# Patient Record
Sex: Male | Born: 1985 | Race: White | Hispanic: No | Marital: Married | Smoking: Never smoker
Health system: Southern US, Community
[De-identification: ages and names within clinical notes are randomized; demographics above are authoritative.]

## PROBLEM LIST (undated history)

## (undated) DIAGNOSIS — K219 Gastro-esophageal reflux disease without esophagitis: Secondary | ICD-10-CM

## (undated) DIAGNOSIS — Z8489 Family history of other specified conditions: Secondary | ICD-10-CM

## (undated) DIAGNOSIS — F909 Attention-deficit hyperactivity disorder, unspecified type: Secondary | ICD-10-CM

## (undated) DIAGNOSIS — F419 Anxiety disorder, unspecified: Secondary | ICD-10-CM

## (undated) DIAGNOSIS — K4090 Unilateral inguinal hernia, without obstruction or gangrene, not specified as recurrent: Secondary | ICD-10-CM

## (undated) DIAGNOSIS — I10 Essential (primary) hypertension: Secondary | ICD-10-CM

## (undated) DIAGNOSIS — J302 Other seasonal allergic rhinitis: Secondary | ICD-10-CM

## (undated) DIAGNOSIS — M51369 Other intervertebral disc degeneration, lumbar region without mention of lumbar back pain or lower extremity pain: Secondary | ICD-10-CM

## (undated) DIAGNOSIS — M5126 Other intervertebral disc displacement, lumbar region: Secondary | ICD-10-CM

## (undated) DIAGNOSIS — M5136 Other intervertebral disc degeneration, lumbar region: Secondary | ICD-10-CM

## (undated) HISTORY — PX: NO PAST SURGERIES: SHX2092

---

## 2014-09-04 ENCOUNTER — Encounter: Payer: Self-pay | Admitting: Emergency Medicine

## 2014-09-04 ENCOUNTER — Emergency Department (INDEPENDENT_AMBULATORY_CARE_PROVIDER_SITE_OTHER): Payer: Self-pay

## 2014-09-04 ENCOUNTER — Emergency Department
Admission: EM | Admit: 2014-09-04 | Discharge: 2014-09-04 | Disposition: A | Discharge status: Home / Self Care | Payer: Self-pay | Source: Home / Self Care | Attending: Family Medicine | Admitting: Family Medicine

## 2014-09-04 DIAGNOSIS — R0781 Pleurodynia: Secondary | ICD-10-CM

## 2014-09-04 DIAGNOSIS — R05 Cough: Secondary | ICD-10-CM

## 2014-09-04 DIAGNOSIS — S29012A Strain of muscle and tendon of back wall of thorax, initial encounter: Secondary | ICD-10-CM

## 2014-09-04 DIAGNOSIS — R059 Cough, unspecified: Secondary | ICD-10-CM

## 2014-09-04 HISTORY — DX: Anxiety disorder, unspecified: F41.9

## 2014-09-04 MED ORDER — MELOXICAM 15 MG PO TABS: 15.0000 mg | ORAL_TABLET | Freq: Every day | ORAL | Status: DC

## 2014-09-04 NOTE — Discharge Instructions (Signed)
Apply ice pack for 20 to 30 minutes, 3 to 4 times daily  Continue until pain decreases.  Wear ace wrap on chest daytime.  Begin range of motion exercises.

## 2014-09-04 NOTE — ED Notes (Signed)
Reports pulling muscles in right scapular area at work 8 days ago; has used tylenol without relief; now pain is moving to include right side.

## 2014-09-04 NOTE — ED Provider Notes (Signed)
CSN: [id number]     Arrival date & time 09/04/14  1820 History   First MD Initiated Contact with Patient 09/04/14 1951     Chief Complaint  Patient presents with  . Chest Pain      HPI Comments: Patient lifted a heavy package overhead about 8 or 9 days ago and heard/felt a pop in his right scapular area. He has continued to have pleuritic pain in his right chest, worse with movement, and two nights ago he had "cold sweats."  He denies cough or fever.                                                                                                                                                                                                                                                                                                                                                                                                                                                                                      The history is provided by the patient.    Past Medical History  Diagnosis Date  . ADD (attention deficit disorder)     as child; no longer on medication  . Anxiety  History reviewed. No pertinent past surgical history. History reviewed. No pertinent family history. History  Substance Use Topics  . Smoking status: Never Smoker   . Smokeless tobacco: Not on file  . Alcohol Use: Yes    Review of Systems  Constitutional: Positive for diaphoresis. Negative for fever, activity change and fatigue.  HENT: Negative.   Eyes: Negative.   Respiratory: Negative for cough, shortness of breath, wheezing and stridor.   Cardiovascular: Negative.   Gastrointestinal: Negative.   Genitourinary: Negative.   Musculoskeletal: Positive for back pain. Negative for myalgias, neck pain and neck stiffness.  Skin: Negative for rash.  All other systems reviewed and are negative.   Allergies  Review of patient's allergies indicates no known allergies.  Home Medications   Prior to Admission  medications   Medication Sig Start Date End Date Taking? Authorizing Provider  meloxicam (MOBIC) 15 MG tablet Take 1 tablet (15 mg total) by mouth daily. Take with food each morning 09/04/14   Lattie Haw, MD   BP 134/85 mmHg  Pulse 80  Temp(Src) 98 F (36.7 C) (Oral)  Resp 18  Ht 5\' 10"  (1.778 m)  Wt 207 lb (93.895 kg)  BMI 29.70 kg/m2  SpO2 100% Physical Exam  Constitutional: He is oriented to person, place, and time. He appears well-developed and well-nourished. No distress.  HENT:  Head: Normocephalic and atraumatic.  Right Ear: External ear normal.  Left Ear: External ear normal.  Mouth/Throat: Oropharynx is clear and moist. No oropharyngeal exudate.  Eyes: Conjunctivae are normal. Pupils are equal, round, and reactive to light.  Neck: Normal range of motion. Neck supple.  Cardiovascular: Normal heart sounds.   Pulmonary/Chest: Breath sounds normal.      There is tenderness along the inferior edge of right scapula extending to right lateral and anterior chest  as noted on diagram.              Abdominal: There is no tenderness.  Neurological: He is alert and oriented to person, place, and time.  Skin: Skin is warm and dry. No rash noted. He is not diaphoretic.  Nursing note and vitals reviewed.   ED Course  Procedures  none  Imaging Review Dg Chest 2 View  09/04/2014   CLINICAL DATA:  Cough.  EXAM: CHEST  2 VIEW  COMPARISON:  None.  FINDINGS: The heart size and mediastinal contours are within normal limits. Both lungs are clear. No pneumothorax or pleural effusion is noted. The visualized skeletal structures are unremarkable.  IMPRESSION: No acute cardiopulmonary abnormality seen.   Electronically Signed   By: Roque Lias M.D.   On: 09/04/2014 20:33   Dg Ribs Unilateral Right  09/04/2014   CLINICAL DATA:  Right-sided rib pain.  EXAM: RIGHT RIBS - 2 VIEW  COMPARISON:  None.  FINDINGS: No fracture or other bone lesions are seen involving the ribs.  IMPRESSION:  Normal right ribs.   Electronically Signed   By: Roque Lias M.D.   On: 09/04/2014 20:35     MDM   1. Rhomboid muscle strain, initial encounter   2. Rib pain on right side   3. Cough    Patient declines rib belt; ace wrap applied.  Rx for Mobic. Apply ice pack for 20 to 30 minutes, 3 to 4 times daily  Continue until pain decreases.  Wear ace wrap on chest daytime.  Begin range of motion exercises.   Followup with Dr. Rodney [facility] ([facility]) if not improving about two  weeks.     Lattie HawStephen A Beese, MD 09/09/14 [phone]

## 2015-02-26 ENCOUNTER — Emergency Department
Admission: EM | Admit: 2015-02-26 | Discharge: 2015-02-26 | Disposition: A | Discharge status: Home / Self Care | Payer: Self-pay | Source: Home / Self Care | Attending: Family Medicine | Admitting: Family Medicine

## 2015-02-26 ENCOUNTER — Encounter: Payer: Self-pay | Admitting: *Deleted

## 2015-02-26 ENCOUNTER — Emergency Department (INDEPENDENT_AMBULATORY_CARE_PROVIDER_SITE_OTHER): Payer: Self-pay

## 2015-02-26 DIAGNOSIS — M5432 Sciatica, left side: Secondary | ICD-10-CM

## 2015-02-26 DIAGNOSIS — M545 Low back pain: Secondary | ICD-10-CM

## 2015-02-26 DIAGNOSIS — R030 Elevated blood-pressure reading, without diagnosis of hypertension: Secondary | ICD-10-CM

## 2015-02-26 MED ORDER — PREDNISONE 20 MG PO TABS
ORAL_TABLET | ORAL | Status: DC
Start: 2015-02-26 — End: 2015-03-05

## 2015-02-26 NOTE — ED Notes (Signed)
Pt c/o LT leg pain with foot numbness x 9 days. Denies injury. Taking Advil prn pain.

## 2015-02-26 NOTE — ED Provider Notes (Signed)
CSN: [id number]     Arrival date & time 02/26/15  0815 History   First MD Initiated Contact with Patient 02/26/15 [phone]     Chief Complaint  Patient presents with  . Leg Pain      HPI Comments: Patient reports that he was working in a narrow crawl space 11 days ago when he felt a pulling sensation in his left posterior thigh.  The ache persisted, and two days ago he developed pain radiating down his left posterior leg to his foot, with occasional numbness in the lateral aspect of his left foot.  The pain is worse when sitting and improves when he lies supine on his right side.  He denies bowel or bladder dysfunction, and no saddle numbness.  He feels well otherwise.  No fevers, chills, and sweats. He reports that he has not had a history of hypertension, but his family history includes hypertension in his father.  His paternal great grandfather had an MI.    Patient is a 29 y.o. male presenting with back pain. The history is provided by the patient.  Back Pain Location:  Gluteal region Quality:  Aching Radiates to:  L posterior upper leg, L foot and L thigh Pain severity:  Moderate Pain is:  Same all the time Onset quality:  Gradual Duration:  11 days Timing:  Constant Progression:  Worsening Chronicity:  New Context: recent injury   Relieved by:  Nothing Worsened by:  Twisting and bending (sitting) Ineffective treatments:  NSAIDs Associated symptoms: leg pain, numbness and paresthesias   Associated symptoms: no abdominal pain, no bladder incontinence, no bowel incontinence, no chest pain, no dysuria, no fever, no pelvic pain, no perianal numbness, no tingling, no weakness and no weight loss   Risk factors: obesity     Past Medical History  Diagnosis Date  . ADD (attention deficit disorder)     as child; no longer on medication  . Anxiety    History reviewed. No pertinent past surgical history. Family History  Problem Relation Age of Onset  . Allergies Mother   . Fibromyalgia  Mother   . Hypertension Father   . ALS Father    History  Substance Use Topics  . Smoking status: Never Smoker   . Smokeless tobacco: Not on file  . Alcohol Use: Yes    Review of Systems  Constitutional: Negative for fever and weight loss.  Cardiovascular: Negative for chest pain.  Gastrointestinal: Negative for abdominal pain and bowel incontinence.  Genitourinary: Negative for bladder incontinence, dysuria and pelvic pain.  Musculoskeletal: Positive for back pain.  Neurological: Positive for numbness and paresthesias. Negative for tingling and weakness.  All other systems reviewed and are negative.   Allergies  Review of patient's allergies indicates no known allergies.  Home Medications   Prior to Admission medications   Medication Sig Start Date End Date Taking? Authorizing Provider  ibuprofen (ADVIL,MOTRIN) 200 MG tablet Take 200 mg by mouth every 6 (six) hours as needed.   Yes Historical Provider, MD  predniSONE (DELTASONE) 20 MG tablet Take one tab by mouth twice daily with food for five days, then decrease to one daily for four days. 02/26/15   Lattie Haw, MD   BP 159/102 mmHg  Pulse 63  Temp(Src) 97.4 F (36.3 C) (Oral)  Resp 18  Ht  (1.778 m)  Wt 212 lb (96.163 kg)  BMI 30.42 kg/m2  SpO2 98% Physical Exam  Constitutional: He is oriented to person, place, and time.  He appears well-developed and well-nourished. No distress.  HENT:  Head: Normocephalic.  Mouth/Throat: Oropharynx is clear and moist.  Eyes: Conjunctivae are normal. Pupils are equal, round, and reactive to light.  Neck: Normal range of motion. Neck supple.  Cardiovascular: Normal heart sounds.   Abdominal: There is no tenderness.  Musculoskeletal: He exhibits no edema.       Lumbar back: He exhibits decreased range of motion and tenderness.  Back:  Decreased forward flexion.  Tenderness in the midline and left paraspinous muscles from L4 to Sacral area.  Straight leg raising test is  positive on the left at about 15 degrees from horizontal.  Sitting knee extension test is positive on the left at about 75 degrees from horizontal.  Strength in the left lower leg is decreased.  Light touch in the left lower leg is normal.  Neurological: He is alert and oriented to person, place, and time. He displays abnormal reflex. He displays no Babinski's sign on the left side.  Reflex Scores:      Patellar reflexes are 2+ on the right side and 2+ on the left side.      Achilles reflexes are 2+ on the right side and 0 on the left side. Skin: Skin is warm and dry. No rash noted.  Nursing note and vitals reviewed.   ED Course  Procedures  none  Imaging Review Dg Lumbar Spine Complete  02/26/2015   CLINICAL DATA:  left thigh and leg pains s/p crawling out of a crawl space 1 week ago, ? Back injury,  EXAM: LUMBAR SPINE - COMPLETE 4+ VIEW  COMPARISON:  None.  FINDINGS: There is no evidence of lumbar spine fracture. Alignment is normal. Intervertebral disc spaces are maintained.  IMPRESSION: Negative.   Electronically Signed   By: Corlis Leak  Hassell M.D.   On: 02/26/2015 09:44     MDM   1. Back pain with left-sided sciatica   2. Blood pressure elevated without history of HTN    Begin prednisone burst. May take Tylenol at bedtime as needed for pain. Followup with Dr. Rodney Langtonhomas Thekkekandam ([facility]) for follow-up and management, including re-evaluation of BP.     Lattie HawStephen A Beese, MD 02/26/15 1043

## 2015-02-26 NOTE — Discharge Instructions (Signed)
May take Tylenol at bedtime as needed for pain.   Sciatica Sciatica is pain, weakness, numbness, or tingling along the path of the sciatic nerve. The nerve starts in the lower back and runs down the back of each leg. The nerve controls the muscles in the lower leg and in the back of the knee, while also providing sensation to the back of the thigh, lower leg, and the sole of your foot. Sciatica is a symptom of another medical condition. For instance, nerve damage or certain conditions, such as a herniated disk or bone spur on the spine, pinch or put pressure on the sciatic nerve. This causes the pain, weakness, or other sensations normally associated with sciatica. Generally, sciatica only affects one side of the body. CAUSES   Herniated or slipped disc.  Degenerative disk disease.  A pain disorder involving the narrow muscle in the buttocks (piriformis syndrome).  Pelvic injury or fracture.  Pregnancy.  Tumor (rare). SYMPTOMS  Symptoms can vary from mild to very severe. The symptoms usually travel from the low back to the buttocks and down the back of the leg. Symptoms can include:  Mild tingling or dull aches in the lower back, leg, or hip.  Numbness in the back of the calf or sole of the foot.  Burning sensations in the lower back, leg, or hip.  Sharp pains in the lower back, leg, or hip.  Leg weakness.  Severe back pain inhibiting movement. These symptoms may get worse with coughing, sneezing, laughing, or prolonged sitting or standing. Also, being overweight may worsen symptoms. DIAGNOSIS  Your caregiver will perform a physical exam to look for common symptoms of sciatica. He or she may ask you to do certain movements or activities that would trigger sciatic nerve pain. Other tests may be performed to find the cause of the sciatica. These may include:  Blood tests.  X-rays.  Imaging tests, such as an MRI or CT scan. TREATMENT  Treatment is directed at the cause of the  sciatic pain. Sometimes, treatment is not necessary and the pain and discomfort goes away on its own. If treatment is needed, your caregiver may suggest:  Over-the-counter medicines to relieve pain.  Prescription medicines, such as anti-inflammatory medicine, muscle relaxants, or narcotics.  Applying heat or ice to the painful area.  Steroid injections to lessen pain, irritation, and inflammation around the nerve.  Reducing activity during periods of pain.  Exercising and stretching to strengthen your abdomen and improve flexibility of your spine. Your caregiver may suggest losing weight if the extra weight makes the back pain worse.  Physical therapy.  Surgery to eliminate what is pressing or pinching the nerve, such as a bone spur or part of a herniated disk. HOME CARE INSTRUCTIONS   Only take over-the-counter or prescription medicines for pain or discomfort as directed by your caregiver.  Apply ice to the affected area for 20 minutes, 3-4 times a day for the first 48-72 hours. Then try heat in the same way.  Exercise, stretch, or perform your usual activities if these do not aggravate your pain.  Attend physical therapy sessions as directed by your caregiver.  Keep all follow-up appointments as directed by your caregiver.  Do not wear high heels or shoes that do not provide proper support.  Check your mattress to see if it is too soft. A firm mattress may lessen your pain and discomfort. SEEK IMMEDIATE MEDICAL CARE IF:   You lose control of your bowel or bladder (incontinence).  You have increasing weakness in the lower back, pelvis, buttocks, or legs.  You have redness or swelling of your back.  You have a burning sensation when you urinate.  You have pain that gets worse when you lie down or awakens you at night.  Your pain is worse than you have experienced in the past.  Your pain is lasting longer than 4 weeks.  You are suddenly losing weight without  reason. MAKE SURE YOU:  Understand these instructions.  Will watch your condition.  Will get help right away if you are not doing well or get worse. Document Released: 09/02/2001 Document Revised: 03/09/2012 Document Reviewed: 01/18/2012 [facility] Patient Information 2015 [place], [place]. This information is not intended to replace advice given to you by your health care provider. Make sure you discuss any questions you have with your health care provider.

## 2015-02-26 NOTE — ED Notes (Signed)
appt sch'ed with Dr T for 06/05/15 @ 9:00(Tammy).

## 2015-03-05 ENCOUNTER — Ambulatory Visit (INDEPENDENT_AMBULATORY_CARE_PROVIDER_SITE_OTHER): Payer: Self-pay | Admitting: Sports Medicine

## 2015-03-05 ENCOUNTER — Encounter: Payer: Self-pay | Admitting: Sports Medicine

## 2015-03-05 VITALS — BP 158/102 | HR 80 | Ht 70.0 in | Wt 211.0 lb

## 2015-03-05 DIAGNOSIS — M5416 Radiculopathy, lumbar region: Secondary | ICD-10-CM

## 2015-03-05 DIAGNOSIS — M5412 Radiculopathy, cervical region: Secondary | ICD-10-CM

## 2015-03-05 MED ORDER — MELOXICAM 15 MG PO TABS: ORAL_TABLET | ORAL | Status: DC
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
## 2015-03-05 NOTE — Assessment & Plan Note (Signed)
Classic left S1 distribution. Unfortunately he has completely lost his left S1 reflex, and is fairly weak to plantar flexion on the left side. He's finished his prednisone, does not yet have insurance, so we aren't able to get an MRI or physical therapy. We will be meloxicam, continue home exercises. He will return in a month, if no better we will proceed very quickly to MRI for interventional planning.

## 2015-03-05 NOTE — Assessment & Plan Note (Signed)
Occurred years ago. Now with persistent left-sided C7 radiculopathy. Again, avoiding x-rays and imaging due to lack of insurance which she will have in one month. Home Rex home rehabilitation exercises given. Return in a month. His father was recently diagnosed with amyotrophic lateral sclerosis, considering his multifocal neurologic complaints we certainly need to keep this in the back of her head this possible diagnosis.

## 2015-04-02 ENCOUNTER — Encounter: Payer: Self-pay | Admitting: Sports Medicine

## 2015-04-02 ENCOUNTER — Ambulatory Visit (INDEPENDENT_AMBULATORY_CARE_PROVIDER_SITE_OTHER): Payer: Managed Care, Other (non HMO) | Admitting: Sports Medicine

## 2015-04-02 VITALS — BP 161/102 | HR 61 | Ht 70.0 in | Wt 209.0 lb

## 2015-04-02 DIAGNOSIS — M5412 Radiculopathy, cervical region: Secondary | ICD-10-CM

## 2015-04-02 DIAGNOSIS — M5416 Radiculopathy, lumbar region: Secondary | ICD-10-CM

## 2015-04-02 DIAGNOSIS — I10 Essential (primary) hypertension: Secondary | ICD-10-CM

## 2015-04-02 MED ORDER — PREDNISONE 10 MG (21) PO TBPK: ORAL_TABLET | ORAL | Status: DC

## 2015-04-02 NOTE — Progress Notes (Signed)
  Subjective:    CC: Follow-up  HPI: Left lumbar radiculopathy: 80% improved with prednisone and rehabilitation exercises  Left cervical radiculopathy: 80% improved with prednisone and rehabilitation exercises  Hypertension: Would like to work a little harder on his low-sodium diet before he proceed to any form of oral intervention.  Past medical history, Surgical history, Family history not pertinant except as noted below, Social history, Allergies, and medications have been entered into the medical record, reviewed, and no changes needed.   Review of Systems: No fevers, chills, night sweats, weight loss, chest pain, or shortness of breath.   Objective:    General: Well Developed, well nourished, and in no acute distress.  Neuro: Alert and oriented x3, extra-ocular muscles intact, sensation grossly intact.  HEENT: Normocephalic, atraumatic, pupils equal round reactive to light, neck supple, no masses, no lymphadenopathy, thyroid nonpalpable.  Skin: Warm and dry, no rashes. Cardiac: Regular rate and rhythm, no murmurs rubs or gallops, no lower extremity edema.  Respiratory: Clear to auscultation bilaterally. Not using accessory muscles, speaking in full sentences.  Impression and Recommendations:

## 2015-04-02 NOTE — Assessment & Plan Note (Signed)
80% improved. We'll do a single additional course of prednisone in a taper, and if still having symptoms we will get an MRI of the next visit. He does have a family history of ALS.

## 2015-04-02 NOTE — Assessment & Plan Note (Signed)
80% improved. We'll do a single additional course of prednisone in a taper, and if still having symptoms we will get an MRI of the next visit. He does have a family history of ALS. 

## 2015-04-02 NOTE — Assessment & Plan Note (Signed)
Continue to work on low-sodium diet, if still elevated at the next visit we will treat him.

## 2015-04-30 ENCOUNTER — Ambulatory Visit (INDEPENDENT_AMBULATORY_CARE_PROVIDER_SITE_OTHER): Payer: Managed Care, Other (non HMO) | Admitting: Sports Medicine

## 2015-04-30 ENCOUNTER — Encounter: Payer: Self-pay | Admitting: Sports Medicine

## 2015-04-30 VITALS — BP 151/98 | HR 75 | Ht 70.0 in | Wt 216.0 lb

## 2015-04-30 DIAGNOSIS — M5412 Radiculopathy, cervical region: Secondary | ICD-10-CM

## 2015-04-30 DIAGNOSIS — I10 Essential (primary) hypertension: Secondary | ICD-10-CM

## 2015-04-30 DIAGNOSIS — M5416 Radiculopathy, lumbar region: Secondary | ICD-10-CM | POA: Diagnosis not present

## 2015-04-30 MED ORDER — LISINOPRIL 10 MG PO TABS: 10.0000 mg | ORAL_TABLET | Freq: Every day | ORAL | Status: DC

## 2015-04-30 NOTE — Assessment & Plan Note (Signed)
Continues to be elevated, starting 10 mg of lisinopril, return in 2 weeks to check blood work and renal function

## 2015-04-30 NOTE — Progress Notes (Signed)
  Subjective:    CC: Follow-up  HPI: Lumbar radiculopathy: Resolved and well controlled with occasional meloxicam.  Cervical radiculopathy: Resolved and controlled with occasional meloxicam.  Hypertension: Persistent despite several months of a low-sodium diet.  Past medical history, Surgical history, Family history not pertinant except as noted below, Social history, Allergies, and medications have been entered into the medical record, reviewed, and no changes needed.   Review of Systems: No fevers, chills, night sweats, weight loss, chest pain, or shortness of breath.   Objective:    General: Well Developed, well nourished, and in no acute distress.  Neuro: Alert and oriented x3, extra-ocular muscles intact, sensation grossly intact.  HEENT: Normocephalic, atraumatic, pupils equal round reactive to light, neck supple, no masses, no lymphadenopathy, thyroid nonpalpable.  Skin: Warm and dry, no rashes. Cardiac: Regular rate and rhythm, no murmurs rubs or gallops, no lower extremity edema.  Respiratory: Clear to auscultation bilaterally. Not using accessory muscles, speaking in full sentences.  Impression and Recommendations:    I spent 25 minutes with this patient, greater than 50% was face-to-face time counseling regarding the above diagnoses

## 2015-04-30 NOTE — Assessment & Plan Note (Signed)
Relatively well-controlled with an occasional meloxicam at this point. No need to proceed further to advanced imaging.

## 2015-04-30 NOTE — Assessment & Plan Note (Signed)
Relatively well-controlled with an occasional meloxicam at this point. No need to proceed further to advanced imaging. 

## 2015-05-14 ENCOUNTER — Encounter: Payer: Self-pay | Admitting: Sports Medicine

## 2015-05-14 ENCOUNTER — Ambulatory Visit (INDEPENDENT_AMBULATORY_CARE_PROVIDER_SITE_OTHER): Payer: Managed Care, Other (non HMO) | Admitting: Sports Medicine

## 2015-05-14 ENCOUNTER — Ambulatory Visit (INDEPENDENT_AMBULATORY_CARE_PROVIDER_SITE_OTHER): Payer: Managed Care, Other (non HMO)

## 2015-05-14 VITALS — BP 154/94 | HR 95 | Ht 70.0 in | Wt 212.0 lb

## 2015-05-14 DIAGNOSIS — I10 Essential (primary) hypertension: Secondary | ICD-10-CM | POA: Diagnosis not present

## 2015-05-14 DIAGNOSIS — M5127 Other intervertebral disc displacement, lumbosacral region: Secondary | ICD-10-CM

## 2015-05-14 DIAGNOSIS — M5416 Radiculopathy, lumbar region: Secondary | ICD-10-CM | POA: Diagnosis not present

## 2015-05-14 LAB — BASIC METABOLIC PANEL
BUN: 19 mg/dL (ref 7–25)
Calcium: 9.2 mg/dL (ref 8.6–10.3)
Creat: 0.84 mg/dL (ref 0.60–1.35)
Glucose, Bld: 100 mg/dL — ABNORMAL HIGH (ref 65–99)

## 2015-05-14 LAB — BASIC METABOLIC PANEL WITH GFR
CO2: 28 mmol/L (ref 20–31)
Chloride: 104 mmol/L (ref 98–110)
Potassium: 4.8 mmol/L (ref 3.5–5.3)
Sodium: 142 mmol/L (ref 135–146)

## 2015-05-14 MED ORDER — PREDNISONE 50 MG PO TABS: ORAL_TABLET | ORAL | Status: DC

## 2015-05-14 NOTE — Progress Notes (Signed)
  Subjective:    CC: follow-up  HPI: Hypertension: Still elevated despite 10 mg of lisinopril, he is in a great deal of pain today, no lip swelling, shortness of breath or cough. No chest pain, headaches, visual changes  Low back pain: History of left lumbar radiculopathy: Did extremely well initially with a conservative course of treatment. Unfortunately having recurrence of left-sided S1 radiculopathy. No bowel or bladder dysfunction, saddle numbness, or constitutional symptoms.  Past medical history, Surgical history, Family history not pertinant except as noted below, Social history, Allergies, and medications have been entered into the medical record, reviewed, and no changes needed.   Review of Systems: No fevers, chills, night sweats, weight loss, chest pain, or shortness of breath.   Objective:    General: Well Developed, well nourished, and in no acute distress.  Neuro: Alert and oriented x3, extra-ocular muscles intact, sensation grossly intact.  HEENT: Normocephalic, atraumatic, pupils equal round reactive to light, neck supple, no masses, no lymphadenopathy, thyroid nonpalpable.  Skin: Warm and dry, no rashes. Cardiac: Regular rate and rhythm, no murmurs rubs or gallops, no lower extremity edema.  Respiratory: Clear to auscultation bilaterally. Not using accessory muscles, speaking in full sentences. Back Exam:  Inspection: Unremarkable  Motion: Flexion 45 deg, Extension 45 deg, Side Bending to 45 deg bilaterally,  Rotation to 45 deg bilaterally  SLR laying: Negative  XSLR laying: Negative  Palpable tenderness: None. FABER: negative. Sensory change: Gross sensation intact to all lumbar and sacral dermatomes.  Reflexes: 2+ at both patellar tendons, 2+ at achilles tendons, Babinski's downgoing.  Strength at foot  Plantar-flexion: 5/5 Dorsi-flexion: 5/5 Eversion: 5/5 Inversion: 5/5  Leg strength  Quad: 5/5 Hamstring: 5/5 Hip flexor: 5/5 Hip abductors: 5/5  Gait  unremarkable.  MRI personally reviewed, there is a large left-sided L5-S1 disc protrusion with clear mass effect on the intraspinal left descending S1 nerve.  Impression and Recommendations:

## 2015-05-14 NOTE — Assessment & Plan Note (Addendum)
Unfortunately having a recurrence, trying to do the rehabilitation exercises but having some pain, prednisone, at this point we are going to obtain an MRI, he has failed greater than 6 weeks of physician directed rehabilitation, NSAIDs, return to see me to go over MRI results.  Considering large S1 protrusion, as well as some weakness of the leg I am going to set him up with a left-sided selective S1 epidural, without also like him touch base with [facility] downstairs considering his weakness.

## 2015-05-14 NOTE — Assessment & Plan Note (Signed)
Elevated today but patient is in significant pain from recurrence of lumbar radiculopathy. For this reason we are only going to increase to 20 mg of lisinopril, checking a BMP, patient will return to go over his MRI results and recheck his blood pressure.

## 2015-05-15 ENCOUNTER — Encounter: Payer: Self-pay | Admitting: Sports Medicine

## 2015-05-15 ENCOUNTER — Ambulatory Visit (INDEPENDENT_AMBULATORY_CARE_PROVIDER_SITE_OTHER): Payer: Managed Care, Other (non HMO) | Admitting: Sports Medicine

## 2015-05-15 VITALS — BP 153/92 | HR 85 | Ht 70.0 in | Wt 210.0 lb

## 2015-05-15 DIAGNOSIS — M5416 Radiculopathy, lumbar region: Secondary | ICD-10-CM

## 2015-05-15 NOTE — Progress Notes (Signed)
  Subjective:    CC: MRI results  HPI: Jakyron returns, he has progressive left-sided S1 radiculopathy now with weakness, he responded well initially to conservative measures including prednisone and physical therapy, unfortunately having a recurrence of pain with weakness to plantar flexion of the left foot.  He does not have any bowel or bladder dysfunction or other symptoms suggestive of cauda equina syndrome or spinal infection.   Past medical history, Surgical history, Family history not pertinant except as noted below, Social history, Allergies, and medications have been entered into the medical record, reviewed, and no changes needed.   Review of Systems: No fevers, chills, night sweats, weight loss, chest pain, or shortness of breath.   Objective:    General: Well Developed, well nourished, and in no acute distress.  Neuro: Alert and oriented x3, extra-ocular muscles intact, sensation grossly intact.  HEENT: Normocephalic, atraumatic, pupils equal round reactive to light, neck supple, no masses, no lymphadenopathy, thyroid nonpalpable.  Skin: Warm and dry, no rashes. Cardiac: Regular rate and rhythm, no murmurs rubs or gallops, no lower extremity edema.  Respiratory: Clear to auscultation bilaterally. Not using accessory muscles, speaking in full sentences.  MRI personally reviewed and shows an L5-S1 disc extrusion with a component that appears to be sequestered clearly contacting the intraspinal left S1 nerve root.  Impression and Recommendations:

## 2015-05-15 NOTE — Assessment & Plan Note (Addendum)
Douglas Lang will pick up his prednisone, I would also like him to proceed with an S1 selective epidural and the left side, considering the size of the extrusion as well as progressive weakness I would like and also touch base with [facility] for consideration of microdiscectomy. I would also like to see Anil back in one month.  Spoke with Dr. Yetta Barre with neurosurgery, patient will be seeing him at 1:30 today. I also called [name], and he agrees to be downstairs in physician specialty offices at 1:30 PM.

## 2015-05-16 ENCOUNTER — Ambulatory Visit
Admission: RE | Admit: 2015-05-16 | Discharge: 2015-05-16 | Disposition: A | Discharge status: Home / Self Care | Payer: Managed Care, Other (non HMO) | Source: Ambulatory Visit | Attending: Sports Medicine | Admitting: Sports Medicine

## 2015-05-16 VITALS — BP 167/75 | HR 83

## 2015-05-16 DIAGNOSIS — F32A Depression, unspecified: Secondary | ICD-10-CM | POA: Insufficient documentation

## 2015-05-16 DIAGNOSIS — Z0000 Encounter for general adult medical examination without abnormal findings: Secondary | ICD-10-CM | POA: Insufficient documentation

## 2015-05-16 DIAGNOSIS — M5416 Radiculopathy, lumbar region: Secondary | ICD-10-CM

## 2015-05-16 DIAGNOSIS — F329 Major depressive disorder, single episode, unspecified: Secondary | ICD-10-CM | POA: Insufficient documentation

## 2015-05-16 MED ORDER — IOHEXOL 180 MG/ML  SOLN
1.0000 mL | Freq: Once | INTRAMUSCULAR | Status: DC | PRN
Start: 2015-05-16 — End: 2015-05-17
  Administered 2015-05-16: 1 mL via EPIDURAL

## 2015-05-16 MED ORDER — METHYLPREDNISOLONE ACETATE 40 MG/ML INJ SUSP (RADIOLOG
120.0000 mg | Freq: Once | INTRAMUSCULAR | Status: AC
  Administered 2015-05-16: 120 mg via EPIDURAL

## 2015-05-16 NOTE — Discharge Instructions (Signed)

## 2015-05-24 ENCOUNTER — Other Ambulatory Visit: Payer: Self-pay | Admitting: Sports Medicine

## 2015-05-24 MED ORDER — LISINOPRIL 20 MG PO TABS: 20.0000 mg | ORAL_TABLET | Freq: Every day | ORAL | Status: DC

## 2015-09-02 ENCOUNTER — Other Ambulatory Visit: Payer: Self-pay | Admitting: Sports Medicine

## 2015-10-20 ENCOUNTER — Other Ambulatory Visit: Payer: Self-pay | Admitting: Sports Medicine

## 2015-11-24 ENCOUNTER — Other Ambulatory Visit: Payer: Self-pay | Admitting: Sports Medicine

## 2016-01-01 ENCOUNTER — Other Ambulatory Visit: Payer: Self-pay | Admitting: Sports Medicine

## 2016-01-08 ENCOUNTER — Telehealth: Payer: Self-pay | Admitting: Sports Medicine

## 2016-01-08 NOTE — Telephone Encounter (Signed)
I called pt to let him know he is due for a f/u appt with Dr.Thekkekandam and he states he will call to schedule

## 2016-01-14 ENCOUNTER — Ambulatory Visit: Payer: Managed Care, Other (non HMO) | Admitting: Sports Medicine

## 2016-01-21 ENCOUNTER — Ambulatory Visit (INDEPENDENT_AMBULATORY_CARE_PROVIDER_SITE_OTHER): Payer: Managed Care, Other (non HMO) | Admitting: Sports Medicine

## 2016-01-21 VITALS — BP 133/91 | HR 69 | Wt 220.0 lb

## 2016-01-21 DIAGNOSIS — F329 Major depressive disorder, single episode, unspecified: Secondary | ICD-10-CM | POA: Diagnosis not present

## 2016-01-21 DIAGNOSIS — M5416 Radiculopathy, lumbar region: Secondary | ICD-10-CM | POA: Diagnosis not present

## 2016-01-21 DIAGNOSIS — I10 Essential (primary) hypertension: Secondary | ICD-10-CM

## 2016-01-21 DIAGNOSIS — F32A Depression, unspecified: Secondary | ICD-10-CM

## 2016-01-21 LAB — CBC
HCT: 47.3 % (ref 38.5–50.0)
Hemoglobin: 16.5 g/dL (ref 13.2–17.1)
MCH: 32 pg (ref 27.0–33.0)
MCHC: 34.9 g/dL (ref 32.0–36.0)
MCV: 91.7 fL (ref 80.0–100.0)
MPV: 10.6 fL (ref 7.5–12.5)
Platelets: 230 10*3/uL (ref 140–400)
RBC: 5.16 MIL/uL (ref 4.20–5.80)
RDW: 13.1 % (ref 11.0–15.0)
WBC: 5 10*3/uL (ref 3.8–10.8)

## 2016-01-21 LAB — LIPID PANEL
Cholesterol: 161 mg/dL (ref 125–200)
HDL: 32 mg/dL — ABNORMAL LOW (ref >=40)
LDL Cholesterol: 94 mg/dL (ref <130)
Total CHOL/HDL Ratio: 5 ratio (ref <=5.0)
Triglycerides: 177 mg/dL — ABNORMAL HIGH (ref <150)
VLDL: 35 mg/dL — ABNORMAL HIGH (ref <30)

## 2016-01-21 LAB — COMPREHENSIVE METABOLIC PANEL WITH GFR
AST: 37 U/L (ref 10–40)
Albumin: 4.3 g/dL (ref 3.6–5.1)
CO2: 28 mmol/L (ref 20–31)
Calcium: 9.2 mg/dL (ref 8.6–10.3)
Chloride: 102 mmol/L (ref 98–110)
Total Bilirubin: 0.5 mg/dL (ref 0.2–1.2)
Total Protein: 6.8 g/dL (ref 6.1–8.1)

## 2016-01-21 LAB — COMPREHENSIVE METABOLIC PANEL
ALT: 91 U/L — ABNORMAL HIGH (ref 9–46)
Alkaline Phosphatase: 38 U/L — ABNORMAL LOW (ref 40–115)
BUN: 14 mg/dL (ref 7–25)
Creat: 0.66 mg/dL (ref 0.60–1.35)
Glucose, Bld: 80 mg/dL (ref 65–99)
Potassium: 4.7 mmol/L (ref 3.5–5.3)
Sodium: 139 mmol/L (ref 135–146)

## 2016-01-21 LAB — HEMOGLOBIN A1C
Hgb A1c MFr Bld: 4.8 % (ref <5.7)
Mean Plasma Glucose: 91 mg/dL

## 2016-01-21 LAB — TSH: TSH: 1.85 mIU/L (ref 0.40–4.50)

## 2016-01-21 MED ORDER — MELOXICAM 15 MG PO TABS: 15.0000 mg | ORAL_TABLET | Freq: Every day | ORAL | Status: DC | PRN

## 2016-01-21 MED ORDER — LISINOPRIL 10 MG PO TABS: 10.0000 mg | ORAL_TABLET | Freq: Every day | ORAL | Status: DC

## 2016-01-21 MED ORDER — DULOXETINE HCL 30 MG PO CPEP: 30.0000 mg | ORAL_CAPSULE | Freq: Every day | ORAL | Status: DC

## 2016-01-21 NOTE — Assessment & Plan Note (Signed)
With grieving, his father is terminally ill. Starting Cymbalta 30. Next line return in one month for a PHQ9.

## 2016-01-21 NOTE — Progress Notes (Signed)
  Subjective:    CC: Follow-up  HPI:  Hypertension: Minimally increased, currently under great do a stress.  Depression: Father is terminally ill, has started to have severe poor energy, moderate anhedonia and depressed mood, mild difficulty sleeping, change in appetite, guilt, difficulty concentrating, and psychomotor retardation, no thoughts of suicide or homicide. Has been on Paxil in the past which gave him too flat of an affect.  Past medical history, Surgical history, Family history not pertinant except as noted below, Social history, Allergies, and medications have been entered into the medical record, reviewed, and no changes needed.   Review of Systems: No fevers, chills, night sweats, weight loss, chest pain, or shortness of breath.   Objective:    General: Well Developed, well nourished, and in no acute distress.  Neuro: Alert and oriented x3, extra-ocular muscles intact, sensation grossly intact.  HEENT: Normocephalic, atraumatic, pupils equal round reactive to light, neck supple, no masses, no lymphadenopathy, thyroid nonpalpable.  Skin: Warm and dry, no rashes. Cardiac: Regular rate and rhythm, no murmurs rubs or gallops, no lower extremity edema.  Respiratory: Clear to auscultation bilaterally. Not using accessory muscles, speaking in full sentences.  Impression and Recommendations:    I spent 25 minutes with this patient, greater than 50% was face-to-face time counseling regarding the above diagnoses

## 2016-01-21 NOTE — Assessment & Plan Note (Signed)
Checking routine bloodwork. 

## 2016-02-19 ENCOUNTER — Encounter: Payer: Self-pay | Admitting: Sports Medicine

## 2016-02-19 ENCOUNTER — Ambulatory Visit (INDEPENDENT_AMBULATORY_CARE_PROVIDER_SITE_OTHER): Payer: Managed Care, Other (non HMO) | Admitting: Sports Medicine

## 2016-02-19 VITALS — BP 148/92 | HR 89 | Temp 97.8°F | Resp 18 | Wt 218.8 lb

## 2016-02-19 DIAGNOSIS — I10 Essential (primary) hypertension: Secondary | ICD-10-CM | POA: Diagnosis not present

## 2016-02-19 DIAGNOSIS — F32A Depression, unspecified: Secondary | ICD-10-CM

## 2016-02-19 DIAGNOSIS — F329 Major depressive disorder, single episode, unspecified: Secondary | ICD-10-CM

## 2016-02-19 MED ORDER — PROMETHAZINE HCL 25 MG PO TABS: 25.0000 mg | ORAL_TABLET | Freq: Four times a day (QID) | ORAL | Status: DC | PRN

## 2016-02-19 MED ORDER — BUPROPION HCL ER (XL) 150 MG PO TB24: 150.0000 mg | ORAL_TABLET | Freq: Every day | ORAL | Status: DC

## 2016-02-19 MED ORDER — LISINOPRIL 20 MG PO TABS
20.0000 mg | ORAL_TABLET | Freq: Every day | ORAL | Status: DC
Start: 2016-02-19 — End: 2017-03-07

## 2016-02-19 NOTE — Assessment & Plan Note (Signed)
Needed to increase his lisinopril to 20 mg. Refilling the 20 mg

## 2016-02-19 NOTE — Assessment & Plan Note (Signed)
Starting Wellbutrin 150, discontinue Cymbalta. Has some nausea, likely from Cymbalta withdrawal, Zofran given in the office oral. Phenergan 25 mg intramuscular as well as or prescription given. Out of work today. Return to see me in one month.

## 2016-02-19 NOTE — Progress Notes (Signed)
.   Subjective:    CC: follow-up  HPI: This is a pleasant 30 year old male, he is here for follow-up of his depression, his father is terminally ill. We started Cymbalta and he had a good improvement in his symptoms but unfortunately developed intolerable nausea and so he stopped the medication. No suicidal or homicidal ideation.  Hypertension: Persistently elevated, increased his lisinopril to 20 mg and blood pressure improved at home, today he is out of medication and needs a refill of the 20 mg dose. No headaches, visual changes, chest pain.  Past medical history, Surgical history, Family history not pertinant except as noted below, Social history, Allergies, and medications have been entered into the medical record, reviewed, and no changes needed.   Review of Systems: No fevers, chills, night sweats, weight loss, chest pain, or shortness of breath.   Objective:    General: Well Developed, well nourished, and in no acute distress.  Neuro: Alert and oriented x3, extra-ocular muscles intact, sensation grossly intact.  HEENT: Normocephalic, atraumatic, pupils equal round reactive to light, neck supple, no masses, no lymphadenopathy, thyroid nonpalpable.  Skin: Warm and dry, no rashes. Cardiac: Regular rate and rhythm, no murmurs rubs or gallops, no lower extremity edema.  Respiratory: Clear to auscultation bilaterally. Not using accessory muscles, speaking in full sentences.  Impression and Recommendations:   I spent 25 minutes with this patient, greater than 50% was face-to-face time counseling regarding the above diagnoses

## 2016-03-21 ENCOUNTER — Encounter: Payer: Self-pay | Admitting: Sports Medicine

## 2016-03-21 ENCOUNTER — Ambulatory Visit (INDEPENDENT_AMBULATORY_CARE_PROVIDER_SITE_OTHER): Payer: Managed Care, Other (non HMO) | Admitting: Sports Medicine

## 2016-03-21 VITALS — BP 128/89 | HR 69 | Wt 218.0 lb

## 2016-03-21 DIAGNOSIS — F329 Major depressive disorder, single episode, unspecified: Secondary | ICD-10-CM

## 2016-03-21 DIAGNOSIS — R7401 Elevation of levels of liver transaminase levels: Secondary | ICD-10-CM | POA: Insufficient documentation

## 2016-03-21 DIAGNOSIS — F32A Depression, unspecified: Secondary | ICD-10-CM

## 2016-03-21 DIAGNOSIS — I10 Essential (primary) hypertension: Secondary | ICD-10-CM | POA: Diagnosis not present

## 2016-03-21 DIAGNOSIS — R740 Nonspecific elevation of levels of transaminase and lactic acid dehydrogenase [LDH]: Secondary | ICD-10-CM

## 2016-03-21 MED ORDER — BUPROPION HCL ER (XL) 300 MG PO TB24: 300.0000 mg | ORAL_TABLET | Freq: Every day | ORAL | Status: DC

## 2016-03-21 NOTE — Assessment & Plan Note (Signed)
Well controlled, no changes 

## 2016-03-21 NOTE — Assessment & Plan Note (Signed)
Improved with switching from Cymbalta to Wellbutrin, gets occasional nausea when he doesn't eat. Increasing to 300 mg. Return in 2 months.

## 2016-03-21 NOTE — Progress Notes (Signed)
  Subjective:    CC: Follow-up  HPI: Depression: Did not respond to Cymbalta with nausea, switched to Wellbutrin 150 and he's done a lot better, still has mild anhedonia, depressed mood, difficulty sleeping, poor energy, overeating, and psychomotor retardation, no suicidal or homicidal ideation.  Nausea: Improved significantly, he did have a mild transaminitis on his recent blood work but declines any further workup.  Past medical history, Surgical history, Family history not pertinant except as noted below, Social history, Allergies, and medications have been entered into the medical record, reviewed, and no changes needed.   Review of Systems: No fevers, chills, night sweats, weight loss, chest pain, or shortness of breath.   Objective:    General: Well Developed, well nourished, and in no acute distress.  Neuro: Alert and oriented x3, extra-ocular muscles intact, sensation grossly intact.  HEENT: Normocephalic, atraumatic, pupils equal round reactive to light, neck supple, no masses, no lymphadenopathy, thyroid nonpalpable.  Skin: Warm and dry, no rashes. Cardiac: Regular rate and rhythm, no murmurs rubs or gallops, no lower extremity edema.  Respiratory: Clear to auscultation bilaterally. Not using accessory muscles, speaking in full sentences.  Impression and Recommendations:    I spent 25 minutes with this patient, greater than 50% was face-to-face time counseling regarding the above diagnoses

## 2016-03-21 NOTE — Assessment & Plan Note (Signed)
This is likely due to hepatic steatosis and is the likely cause of his nausea. I have recommended rechecking LFTs however he declines, risks explained.

## 2016-04-01 ENCOUNTER — Telehealth: Payer: Self-pay | Admitting: Sports Medicine

## 2016-04-01 NOTE — Telephone Encounter (Signed)
Pt walked into clinic today stating he is going to stop his Wellbutrin. Pt reports this Rx is giving him "extreme nausea" and he "shouldn't have to take an nausea Rx everyday." Advised Pt he should schedule an appt with his PCP. Offered appointments at various times throughout the week, Pt denied. Pt states "I have an herb at home that will control my depression, I don't need these pills." Offered appointment again, Pt denied. Informed Pt to contact us if he needed us, Pt verbalized understanding and exited clinic.

## 2016-04-01 NOTE — Telephone Encounter (Signed)
Noted, preesh.

## 2016-06-23 ENCOUNTER — Ambulatory Visit (INDEPENDENT_AMBULATORY_CARE_PROVIDER_SITE_OTHER): Payer: Managed Care, Other (non HMO) | Admitting: Sports Medicine

## 2016-06-23 ENCOUNTER — Encounter: Payer: Self-pay | Admitting: Sports Medicine

## 2016-06-23 DIAGNOSIS — F32A Depression, unspecified: Secondary | ICD-10-CM

## 2016-06-23 DIAGNOSIS — R1013 Epigastric pain: Secondary | ICD-10-CM | POA: Insufficient documentation

## 2016-06-23 DIAGNOSIS — F329 Major depressive disorder, single episode, unspecified: Secondary | ICD-10-CM

## 2016-06-23 MED ORDER — PANTOPRAZOLE SODIUM 40 MG PO TBEC: 40.0000 mg | DELAYED_RELEASE_TABLET | Freq: Every day | ORAL | 3 refills | Status: DC

## 2016-06-23 NOTE — Progress Notes (Signed)
  Subjective:    CC: Follow-up  HPI: Anxiety and depression: All symptoms are now resolved. He really doesn't take his Wellbutrin at all, got some nausea with Cymbalta, if we have to go back to an antidepressant he requests Effexor.  Epigastric pain: Worse when eating greasy foods, pain is immediate, epigastric and substernal, no radiation to the right upper quadrant or the shoulder blade. No melena, hematochezia, hematemesis. No fevers, chills, night sweats, weight loss.  Past medical history:  Negative.  See flowsheet/record as well for more information.  Surgical history: Negative.  See flowsheet/record as well for more information.  Family history: Negative.  See flowsheet/record as well for more information.  Social history: Negative.  See flowsheet/record as well for more information.  Allergies, and medications have been entered into the medical record, reviewed, and no changes needed.   Review of Systems: No fevers, chills, night sweats, weight loss, chest pain, or shortness of breath.   Objective:    General: Well Developed, well nourished, and in no acute distress.  Neuro: Alert and oriented x3, extra-ocular muscles intact, sensation grossly intact.  HEENT: Normocephalic, atraumatic, pupils equal round reactive to light, neck supple, no masses, no lymphadenopathy, thyroid nonpalpable.  Skin: Warm and dry, no rashes. Cardiac: Regular rate and rhythm, no murmurs rubs or gallops, no lower extremity edema.  Respiratory: Clear to auscultation bilaterally. Not using accessory muscles, speaking in full sentences. Abdomen: Soft, nontender, nondistended, no bowel sounds, no palpable masses, no guarding, rigidity, rebound tenderness.  Impression and Recommendations:    Depression All symptoms have resolved, may discontinue medication.  Dyspepsia Typically after eating greasy or spicy foods, differential includes biliary colic and functional dyspepsia. Adding protonix 40 mg  daily. Return to see me in 6 weeks, referral for EGD if no better.

## 2016-06-23 NOTE — Assessment & Plan Note (Signed)
All symptoms have resolved, may discontinue medication.

## 2016-06-23 NOTE — Assessment & Plan Note (Signed)
Typically after eating greasy or spicy foods, differential includes biliary colic and functional dyspepsia. Adding protonix 40 mg daily. Return to see me in 6 weeks, referral for EGD if no better.

## 2016-08-04 ENCOUNTER — Ambulatory Visit: Payer: Managed Care, Other (non HMO) | Admitting: Sports Medicine

## 2016-10-01 ENCOUNTER — Encounter: Payer: Self-pay | Admitting: *Deleted

## 2016-10-01 ENCOUNTER — Emergency Department
Admission: EM | Admit: 2016-10-01 | Discharge: 2016-10-01 | Disposition: A | Discharge status: Home / Self Care | Payer: Managed Care, Other (non HMO) | Source: Home / Self Care | Attending: Family Medicine | Admitting: Family Medicine

## 2016-10-01 DIAGNOSIS — R110 Nausea: Secondary | ICD-10-CM

## 2016-10-01 DIAGNOSIS — K219 Gastro-esophageal reflux disease without esophagitis: Secondary | ICD-10-CM

## 2016-10-01 HISTORY — DX: Essential (primary) hypertension: I10

## 2016-10-01 MED ORDER — PANTOPRAZOLE SODIUM 20 MG PO TBEC: 20.0000 mg | DELAYED_RELEASE_TABLET | Freq: Every day | ORAL | 2 refills | Status: DC

## 2016-10-01 NOTE — ED Triage Notes (Signed)
Pt c/o nausea x 1 day. Reports minimal vomiting. Denies diarrhea, abd pain or fever. He took phenergan today at 0630.

## 2016-10-01 NOTE — Discharge Instructions (Addendum)
May continue nausea medication until nausea resolved.

## 2016-10-17 NOTE — ED Provider Notes (Signed)
[name]    CSN: [id number] Arrival date & time: 10/01/16  4098     History   Chief Complaint Chief Complaint  Patient presents with  . Nausea    HPI Douglas Lang is a 31 y.o. male.   Patient has a history of GERD for which he has taken Protonix in the past.  Yesterday he developed nausea without vomiting that has persisted.  He denies abdominal pain.  No changes in bowel movements.  No fevers, chills, and sweats.  He feels well otherwise.   The history is provided by the patient.    Past Medical History:  Diagnosis Date  . ADD (attention deficit disorder)    as child; no longer on medication  . Anxiety   . Hypertension     Patient Active Problem List   Diagnosis Date Noted  . Dyspepsia 06/23/2016  . Transaminitis 03/21/2016  . Depression 05/16/2015  . Routine general medical examination at a health care facility 05/16/2015  . Essential hypertension, benign 04/02/2015  . Left lumbar radiculopathy 03/05/2015  . Left cervical radiculopathy 03/05/2015    History reviewed. No pertinent surgical history.     Home Medications    Prior to Admission medications   Medication Sig Start Date End Date Taking? Authorizing Provider  lisinopril (PRINIVIL,ZESTRIL) 20 MG tablet Take 1 tablet (20 mg total) by mouth daily. 02/19/16  Yes Monica Becton, MD  meloxicam (MOBIC) 15 MG tablet Take 1 tablet (15 mg total) by mouth daily as needed for pain. One tab PO qAM with breakfast for 2 weeks, then daily prn pain. 01/21/16  Yes Monica Becton, MD  pantoprazole (PROTONIX) 20 MG tablet Take 1 tablet (20 mg total) by mouth daily. 10/01/16   Lattie Haw, MD    Family History Family History  Problem Relation Age of Onset  . Allergies Mother   . Fibromyalgia Mother   . Hypertension Father   . ALS Father     Social History Social History  Substance Use Topics  . Smoking status: Never Smoker  . Smokeless tobacco: Never Used  . Alcohol use Yes      Allergies   Patient has no known allergies.   Review of Systems Review of Systems  Constitutional: Positive for appetite change and fatigue. Negative for activity change, chills, diaphoresis, fever and unexpected weight change.  HENT: Negative.   Eyes: Negative.   Respiratory: Negative.   Cardiovascular: Negative.   Gastrointestinal: Positive for nausea. Negative for abdominal distention, abdominal pain, anal bleeding, blood in stool, constipation, diarrhea, rectal pain and vomiting.  Endocrine: Negative.   Genitourinary: Negative.   Musculoskeletal: Negative.   Skin: Negative.   Neurological: Negative for headaches.     Physical Exam Triage Vital Signs ED Triage Vitals  Enc Vitals Group     BP 10/01/16 0931 (!) 166/113     Pulse Rate 10/01/16 0931 97     Resp 10/01/16 0931 18     Temp 10/01/16 0931 98.3 F (36.8 C)     Temp Source 10/01/16 0931 Oral     SpO2 10/01/16 0931 97 %     Weight 10/01/16 0932 228 lb (103.4 kg)     Height 10/01/16 0932 5\' 10"  (1.778 m)     Head Circumference --      Peak Flow --      Pain Score 10/01/16 0933 0     Pain Loc --      Pain Edu? --  Excl. in GC? --    No data found.   Updated Vital Signs BP (!) 166/113 (BP Location: Left Arm) Comment: pt did not take lisinopril yesterday or today.  Pulse 97   Temp 98.3 F (36.8 C) (Oral)   Resp 18   Ht 5\' 10"  (1.778 m)   Wt 228 lb (103.4 kg)   SpO2 97%   BMI 32.71 kg/m   Visual Acuity Right Eye Distance:   Left Eye Distance:   Bilateral Distance:    Right Eye Near:   Left Eye Near:    Bilateral Near:     Physical Exam Nursing notes and Vital Signs reviewed. Appearance:  Patient appears stated age, and in no acute distress Eyes:  Pupils are equal, round, and reactive to light and accomodation.  Extraocular movement is intact.  Conjunctivae are not inflamed  Ears:  Canals normal.  Tympanic membranes normal.  Nose:   Normal turbinates.  No sinus tenderness.      Pharynx:  Normal Neck:  Supple.  No adenopathy. Lungs:  Clear to auscultation.  Breath sounds are equal.  Moving air well. Heart:  Regular rate and rhythm without murmurs, rubs, or gallops.  Abdomen:  Nontender without masses or hepatosplenomegaly.  Bowel sounds are present.  No CVA or flank tenderness.  Extremities:  No edema.  Skin:  No rash present.    UC Treatments / Results  Labs (all labs ordered are listed, but only abnormal results are displayed) Labs Reviewed - No data to display  EKG  EKG Interpretation None       Radiology No results found.  Procedures Procedures (including critical care time)  Medications Ordered in UC Medications - No data to display   Initial Impression / Assessment and Plan / UC Course  I have reviewed the triage vital signs and the nursing notes.  Pertinent labs & imaging results that were available during my care of the patient were reviewed by me and considered in my medical decision making (see chart for details).    Begin Protonix. May continue nausea medication until nausea resolved. Followup with PCP Note elevated BP. Monitor blood pressure more frequently at different times of day and record on a calendar.     Final Clinical Impressions(s) / UC Diagnoses   Final diagnoses:  Nausea without vomiting  Gastroesophageal reflux disease, esophagitis presence not specified    New Prescriptions Discharge Medication List as of 10/01/2016 10:15 AM    START taking these medications   Details  pantoprazole (PROTONIX) 20 MG tablet Take 1 tablet (20 mg total) by mouth daily., Starting Wed 10/01/2016, Print         Lattie HawStephen A Gentry Seeber, MD 10/17/16 [phone]

## 2017-01-23 IMAGING — CR DG LUMBAR SPINE COMPLETE 4+V
5 series · 5 of 5 positions shown · non-contrast
Comparison: None.

CLINICAL DATA: left thigh and leg pains s/p crawling out of a crawl
space 1 week ago, ? Back injury,

EXAM:
LUMBAR SPINE - COMPLETE 4+ VIEW

[l-spine ap]
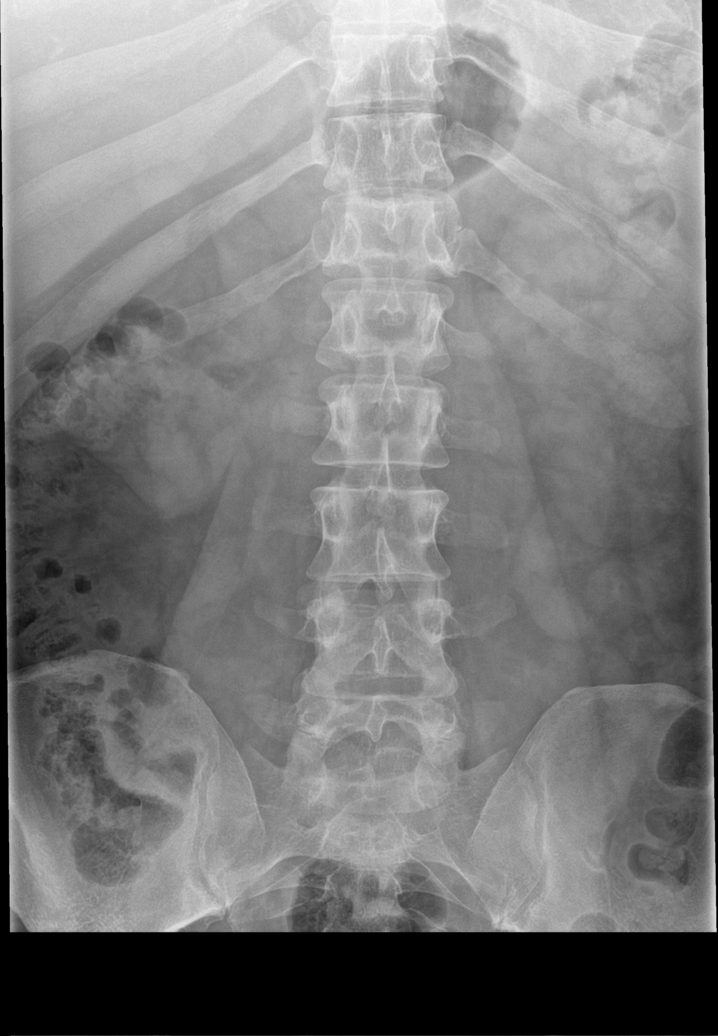

[l-spine obl (1 of 2)]
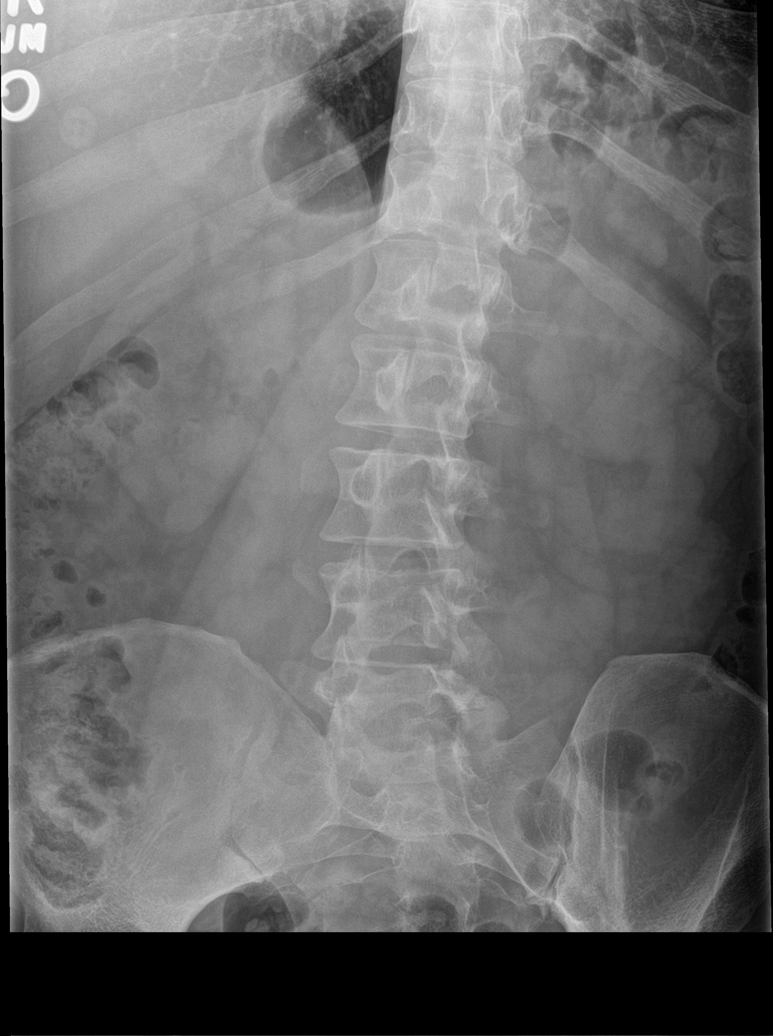

[l-spine obl (2 of 2)]
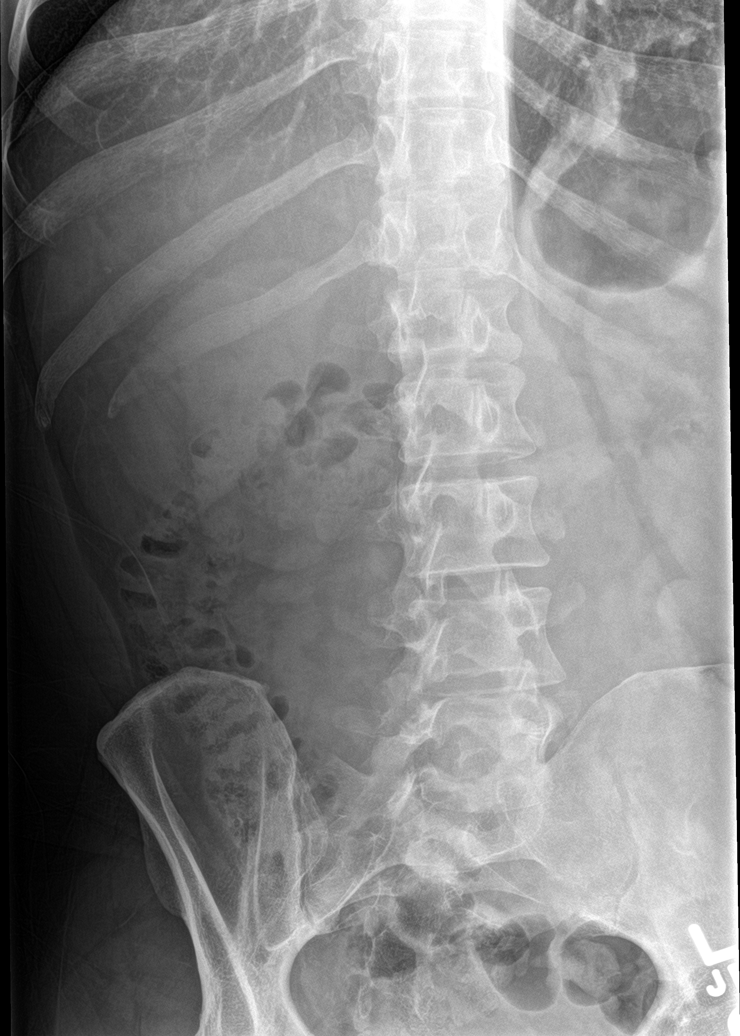

[l-spine lat]
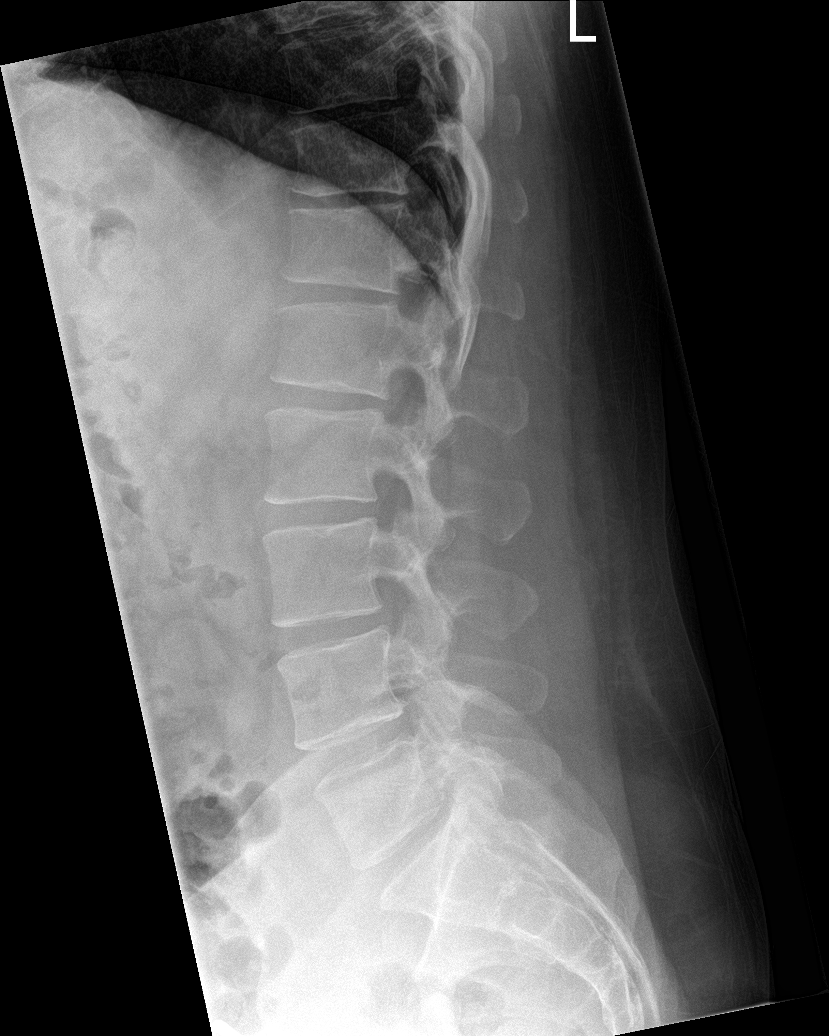

[l-spine spot]
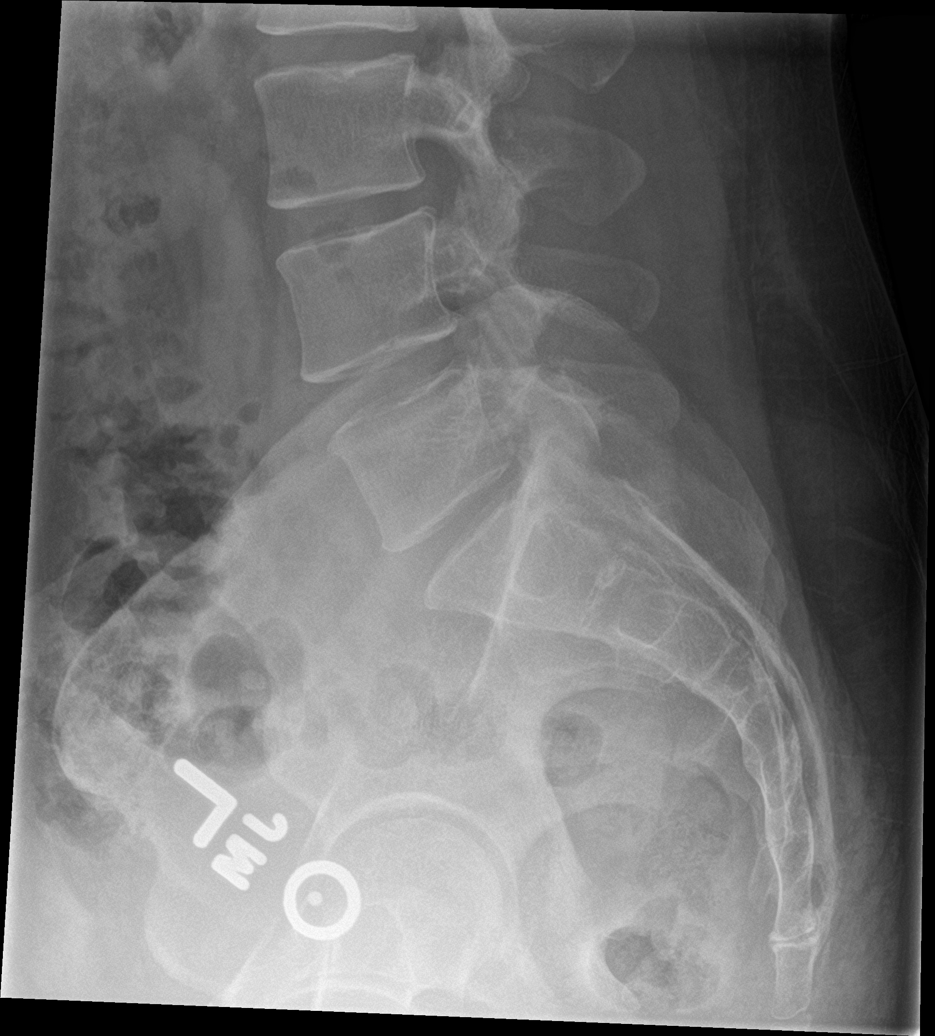

[5 of 5 positions shown; findings below may reference images not displayed]

FINDINGS: There is no evidence of lumbar spine fracture. Alignment is normal.
Intervertebral disc spaces are maintained.
IMPRESSION: Negative.

## 2017-03-07 ENCOUNTER — Other Ambulatory Visit: Payer: Self-pay | Admitting: Sports Medicine

## 2017-03-07 DIAGNOSIS — I10 Essential (primary) hypertension: Secondary | ICD-10-CM

## 2017-12-09 ENCOUNTER — Encounter: Payer: Self-pay | Admitting: Sports Medicine

## 2017-12-09 ENCOUNTER — Ambulatory Visit (INDEPENDENT_AMBULATORY_CARE_PROVIDER_SITE_OTHER): Payer: Self-pay | Admitting: Sports Medicine

## 2017-12-09 DIAGNOSIS — K4090 Unilateral inguinal hernia, without obstruction or gangrene, not specified as recurrent: Secondary | ICD-10-CM

## 2017-12-09 NOTE — Progress Notes (Signed)
  Subjective:    CC: Abdominal mass  HPI: This is a pleasant 32 year old male, for the past year he has had a self reducing mass in his left lower pelvis, worse and protrudes more with straining, Valsalva.  No bowel or bladder dysfunction, no nausea, vomiting, no abdominal pain.  Symptoms are moderate, persistent.  I reviewed the past medical history, family history, social history, surgical history, and allergies today and no changes were needed.  Please see the problem list section below in epic for further details.  Past Medical History: Past Medical History:  Diagnosis Date  . ADD (attention deficit disorder)    as child; no longer on medication  . Anxiety   . Hypertension    Past Surgical History: No past surgical history on file. Social History: Social History   Socioeconomic History  . Marital status: Single    Spouse name: None  . Number of children: None  . Years of education: None  . Highest education level: None  Social Needs  . Financial resource strain: None  . Food insecurity - worry: None  . Food insecurity - inability: None  . Transportation needs - medical: None  . Transportation needs - non-medical: None  Occupational History  . None  Tobacco Use  . Smoking status: Never Smoker  . Smokeless tobacco: Never Used  Substance and Sexual Activity  . Alcohol use: Yes  . Drug use: No  . Sexual activity: None  Other Topics Concern  . None  Social History Narrative  . None   Family History: Family History  Problem Relation Age of Onset  . Allergies Mother   . Fibromyalgia Mother   . Hypertension Father   . ALS Father    Allergies: No Known Allergies Medications: See med rec.  Review of Systems: No fevers, chills, night sweats, weight loss, chest pain, or shortness of breath.   Objective:    General: Well Developed, well nourished, and in no acute distress.  Neuro: Alert and oriented x3, extra-ocular muscles intact, sensation grossly intact.    HEENT: Normocephalic, atraumatic, pupils equal round reactive to light, neck supple, no masses, no lymphadenopathy, thyroid nonpalpable.  Skin: Warm and dry, no rashes. Cardiac: Regular rate and rhythm, no murmurs rubs or gallops, no lower extremity edema.  Respiratory: Clear to auscultation bilaterally. Not using accessory muscles, speaking in full sentences. Abdomen: Soft, nontender, nondistended, no palpable masses, no guarding, rigidity, rebound tenderness, inguinal exam does show a fairly large hernia on the left that is palpable at the external ring with Valsalva.  Impression and Recommendations:    Left inguinal hernia There is a large nonincarcerated, non-strangulated left inguinal hernia. 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 This is until we can get the hernia treated, referral to general surgery. ___________________________________________ Ihor Austinhomas J. Benjamin Stainhekkekandam, M.D., ABFM., CAQSM. Primary Care and Sports Medicine [facility]  Adjunct Instructor of Family Medicine  [employer]

## 2017-12-09 NOTE — Patient Instructions (Signed)

## 2017-12-09 NOTE — Assessment & Plan Note (Signed)
There is a large nonincarcerated, non-strangulated left inguinal hernia. 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 This is until we can get the hernia treated, referral to general surgery.

## 2017-12-21 ENCOUNTER — Ambulatory Visit: Payer: Self-pay | Admitting: Surgery

## 2017-12-21 DIAGNOSIS — K4090 Unilateral inguinal hernia, without obstruction or gangrene, not specified as recurrent: Secondary | ICD-10-CM

## 2017-12-21 HISTORY — DX: Unilateral inguinal hernia, without obstruction or gangrene, not specified as recurrent: K40.90

## 2017-12-21 NOTE — H&P (View-Only) (Signed)
Douglas Lang Documented: 12/21/2017 10:44 AM Location: [facility] Patient #: [id number] DOB: Sep 04, 1986 Single / Language: [place] / Race: White Male  History of Present Illness Douglas Fus A. Douglas Hemric MD; 12/21/2017 11:25 AM) Patient words: Patient sent at the request of Dr.Thekkekandam for a one-month history of left groin bulge. The patient states he noticed a bulge in his left groin about a month ago. It's been stable in size. It causes mild discomfort especially when lifting, coughing or straining. There is no radiation of pain from his left groin. He denies any testicle pain. He denies tobacco or abdominal pain with this. The swelling and bulge or much more noticeable when he stands or strains. Locations left groin. He denies any right groin pain. He denies any dysuria, constipation or other symptom.  The patient is a 32 year old male.   Past Surgical History (Douglas Lang, RMA; 12/21/2017 10:44 AM) No pertinent past surgical history  Diagnostic Studies History (Douglas Lang, RMA; 12/21/2017 10:44 AM) Colonoscopy never  Allergies (Douglas Lang, RMA; 12/21/2017 10:45 AM) No Known Drug Allergies [12/21/2017]: Allergies Reconciled  Medication History (Douglas Lang, RMA; 12/21/2017 10:45 AM) Lisinopril (20MG  Tablet, Oral) Active. Medications Reconciled  Social History (Douglas Lang, RMA; 12/21/2017 10:44 AM) Alcohol use Occasional alcohol use. Caffeine use Carbonated beverages, Tea. No drug use Tobacco use Never smoker.  Family History (Douglas Lang, RMA; 12/21/2017 10:44 AM) Diabetes Mellitus Father. Hypertension Father, Mother.  Other Problems (Douglas Lang, RMA; 12/21/2017 10:44 AM) High blood pressure     Review of Systems (Douglas Kittel A. Nickolai Rinks MD; 12/21/2017 11:26 AM) General Not Present- Appetite Loss, Chills, Fatigue, Fever, Night Sweats, Weight Gain and Weight Loss. Skin Not Present- Change in Wart/Mole, Dryness, Hives,  Jaundice, New Lesions, Non-Healing Wounds, Rash and Ulcer. HEENT Present- Seasonal Allergies and Wears glasses/contact lenses. Not Present- Earache, Hearing Loss, Hoarseness, Nose Bleed, Oral Ulcers, Ringing in the Ears, Sinus Pain, Sore Throat, Visual Disturbances and Yellow Eyes. Respiratory Not Present- Bloody sputum, Chronic Cough, Difficulty Breathing, Snoring and Wheezing. Breast Not Present- Breast Mass, Breast Pain, Nipple Discharge and Skin Changes. Cardiovascular Not Present- Chest Pain, Difficulty Breathing Lying Down, Leg Cramps, Palpitations, Rapid Heart Rate, Shortness of Breath and Swelling of Extremities. Gastrointestinal Not Present- Abdominal Pain, Bloating, Bloody Stool, Change in Bowel Habits, Chronic diarrhea, Constipation, Difficulty Swallowing, Excessive gas, Gets full quickly at meals, Hemorrhoids, Indigestion, Nausea, Rectal Pain and Vomiting. Male Genitourinary Not Present- Blood in Urine, Change in Urinary Stream, Frequency, Impotence, Nocturia, Painful Urination, Urgency and Urine Leakage. Musculoskeletal Not Present- Back Pain, Joint Pain, Joint Stiffness, Muscle Pain, Muscle Weakness and Swelling of Extremities. Neurological Not Present- Decreased Memory, Fainting, Headaches, Numbness, Seizures, Tingling, Tremor, Trouble walking and Weakness. Psychiatric Not Present- Anxiety, Bipolar, Change in Sleep Pattern, Depression, Fearful and Frequent crying. Endocrine Not Present- Cold Intolerance, Excessive Hunger, Hair Changes, Heat Intolerance, Hot flashes and New Diabetes. Hematology Not Present- Blood Thinners, Easy Bruising, Excessive bleeding, Gland problems, HIV and Persistent Infections. All other systems negative  Vitals (Douglas Lang RMA; 12/21/2017 10:45 AM) 12/21/2017 10:45 AM Weight: 228.2 lb Height: 70in Body Surface Area: 2.21 m Body Mass Index: 32.74 kg/m  Temp.: 98.49F  Pulse: 88 (Regular)  BP: 128/84 (Sitting, Left Arm,  Standard)      Physical Exam (Douglas Lightner A. Taris Galindo MD; 12/21/2017 11:26 AM)  General Mental Status-Alert. General Appearance-Consistent with stated age. Hydration-Well hydrated. Voice-Normal.  Head and Neck Head-normocephalic, atraumatic with no lesions or palpable masses. Trachea-midline.  Chest and Lung Exam Chest and lung exam reveals -quiet, even and easy respiratory effort with no use of accessory muscles and on auscultation, normal breath sounds, no adventitious sounds and normal vocal resonance. Inspection Chest Wall - Normal. Back - normal.  Cardiovascular Cardiovascular examination reveals -normal heart sounds, regular rate and rhythm with no murmurs and normal pedal pulses bilaterally.  Abdomen Note: reducible moderate size LIH reducible NO RIH testicles and penis normal  Neurologic Neurologic evaluation reveals -alert and oriented x 3 with no impairment of recent or remote memory. Mental Status-Normal.  Musculoskeletal Normal Exam - Left-Upper Extremity Strength Normal and Lower Extremity Strength Normal. Normal Exam - Right-Upper Extremity Strength Normal, Lower Extremity Weakness.    Assessment & Plan (Douglas Celia A. Parthena Fergeson MD; 12/21/2017 11:27 AM)  LEFT INGUINAL HERNIA (K40.90) Impression: Discussed repair of left inguinal hernia with mesh. Discussed laparoscopic and open approaches. Discussed the pros and cons and long-term expectations and recovery from each. He has The risk of hernia repair include bleeding, infection, organ injury, bowel injury, bladder injury, nerve injury recurrent hernia, blood clots, worsening of underlying condition, chronic pain, mesh use, open surgery, death, and the need for other operattions. Pt agrees to proceed opted for open left inguinal hernia with mesh  Current Plans You are being scheduled for surgery- Our schedulers will call you.  You should hear from our office's scheduling department within 5  working days about the location, date, and time of surgery. We try to make accommodations for patient's preferences in scheduling surgery, but sometimes the OR schedule or the surgeon's schedule prevents us from making those accommodations.  If you have not heard from our office [phone]) in 5 working days, call the office and ask for your surgeon's nurse.  If you have other questions about your diagnosis, plan, or surgery, call the office and ask for your surgeon's nurse.  Pt Education - Pamphlet Given - Hernia Surgery: discussed with patient and provided information. The anatomy & physiology of the abdominal wall and pelvic floor was discussed. The pathophysiology of hernias in the inguinal and pelvic region was discussed. Natural history risks such as progressive enlargement, pain, incarceration, and strangulation was discussed. Contributors to complications such as smoking, obesity, diabetes, prior surgery, etc were discussed.  I feel the risks of no intervention will lead to serious problems that outweigh the operative risks; therefore, I recommended surgery to reduce and repair the hernia. I explained lapartechniques with need for an open approach. I noted usual use of mesh to patch and/or buttress hernia repair  Risks such as bleeding, infection, abscess, need for further treatment, heart attack, death, chronic pain, mesh use . testicle swelling infertility , organ injury, and other risks were discussed. I noted a good likelihood this will help address the problem. Goals of post-operative recovery were discussed as well. Possibility that this will not correct all symptoms was explained. I stressed the importance of low-impact activity, aggressive pain control, avoiding constipation, & not pushing through pain to minimize risk of post-operative chronic pain or injury. Possibility of reherniation was discussed. We will work to minimize complications.  An educational handout  further explaining the pathology & treatment options was given as well. Questions were answered. The patient expresses understanding & wishes to proceed with surgery.  Pt Education - CCS Mesh education: discussed with patient and provided information.

## 2017-12-21 NOTE — H&P (Signed)
Douglas Lang Documented: 12/21/2017 10:44 AM Location: [facility] Patient #: [id number] DOB: Sep 04, 1986 Single / Language: [place] / Race: White Male  History of Present Illness Douglas Fus A. Delta Pichon MD; 12/21/2017 11:25 AM) Patient words: Patient sent at the request of Dr.Thekkekandam for a one-month history of left groin bulge. The patient states he noticed a bulge in his left groin about a month ago. It's been stable in size. It causes mild discomfort especially when lifting, coughing or straining. There is no radiation of pain from his left groin. He denies any testicle pain. He denies tobacco or abdominal pain with this. The swelling and bulge or much more noticeable when he stands or strains. Locations left groin. He denies any right groin pain. He denies any dysuria, constipation or other symptom.  The patient is a 32 year old male.   Past Surgical History (Douglas Lang, Lang; 12/21/2017 10:44 AM) No pertinent past surgical history  Diagnostic Studies History (Douglas Lang, Lang; 12/21/2017 10:44 AM) Colonoscopy never  Allergies (Douglas Lang, Lang; 12/21/2017 10:45 AM) No Known Drug Allergies [12/21/2017]: Allergies Reconciled  Medication History (Douglas Lang, Lang; 12/21/2017 10:45 AM) Lisinopril (20MG  Tablet, Oral) Active. Medications Reconciled  Social History (Douglas Lang, Lang; 12/21/2017 10:44 AM) Alcohol use Occasional alcohol use. Caffeine use Carbonated beverages, Tea. No drug use Tobacco use Never smoker.  Family History (Douglas Lang, Lang; 12/21/2017 10:44 AM) Diabetes Mellitus Father. Hypertension Father, Mother.  Other Problems (Douglas Lang, Lang; 12/21/2017 10:44 AM) High blood pressure     Review of Systems (Douglas Shevchenko A. Dazaria Macneill MD; 12/21/2017 11:26 AM) General Not Present- Appetite Loss, Chills, Fatigue, Fever, Night Sweats, Weight Gain and Weight Loss. Skin Not Present- Change in Wart/Mole, Dryness, Hives,  Jaundice, New Lesions, Non-Healing Wounds, Rash and Ulcer. HEENT Present- Seasonal Allergies and Wears glasses/contact lenses. Not Present- Earache, Hearing Loss, Hoarseness, Nose Bleed, Oral Ulcers, Ringing in the Ears, Sinus Pain, Sore Throat, Visual Disturbances and Yellow Eyes. Respiratory Not Present- Bloody sputum, Chronic Cough, Difficulty Breathing, Snoring and Wheezing. Breast Not Present- Breast Mass, Breast Pain, Nipple Discharge and Skin Changes. Cardiovascular Not Present- Chest Pain, Difficulty Breathing Lying Down, Leg Cramps, Palpitations, Rapid Heart Rate, Shortness of Breath and Swelling of Extremities. Gastrointestinal Not Present- Abdominal Pain, Bloating, Bloody Stool, Change in Bowel Habits, Chronic diarrhea, Constipation, Difficulty Swallowing, Excessive gas, Gets full quickly at meals, Hemorrhoids, Indigestion, Nausea, Rectal Pain and Vomiting. Male Genitourinary Not Present- Blood in Urine, Change in Urinary Stream, Frequency, Impotence, Nocturia, Painful Urination, Urgency and Urine Leakage. Musculoskeletal Not Present- Back Pain, Joint Pain, Joint Stiffness, Muscle Pain, Muscle Weakness and Swelling of Extremities. Neurological Not Present- Decreased Memory, Fainting, Headaches, Numbness, Seizures, Tingling, Tremor, Trouble walking and Weakness. Psychiatric Not Present- Anxiety, Bipolar, Change in Sleep Pattern, Depression, Fearful and Frequent crying. Endocrine Not Present- Cold Intolerance, Excessive Hunger, Hair Changes, Heat Intolerance, Hot flashes and New Diabetes. Hematology Not Present- Blood Thinners, Easy Bruising, Excessive bleeding, Gland problems, HIV and Persistent Infections. All other systems negative  Vitals (Douglas Lang; 12/21/2017 10:45 AM) 12/21/2017 10:45 AM Weight: 228.2 lb Height: 70in Body Surface Area: 2.21 m Body Mass Index: 32.74 kg/m  Temp.: 98.49F  Pulse: 88 (Regular)  BP: 128/84 (Sitting, Left Arm,  Standard)      Physical Exam (Douglas Stirling A. Eivan Gallina MD; 12/21/2017 11:26 AM)  General Mental Status-Alert. General Appearance-Consistent with stated age. Hydration-Well hydrated. Voice-Normal.  Head and Neck Head-normocephalic, atraumatic with no lesions or palpable masses. Trachea-midline.  Chest and Lung Exam Chest and lung exam reveals -quiet, even and easy respiratory effort with no use of accessory muscles and on auscultation, normal breath sounds, no adventitious sounds and normal vocal resonance. Inspection Chest Wall - Normal. Back - normal.  Cardiovascular Cardiovascular examination reveals -normal heart sounds, regular rate and rhythm with no murmurs and normal pedal pulses bilaterally.  Abdomen Note: reducible moderate size LIH reducible NO RIH testicles and penis normal  Neurologic Neurologic evaluation reveals -alert and oriented x 3 with no impairment of recent or remote memory. Mental Status-Normal.  Musculoskeletal Normal Exam - Left-Upper Extremity Strength Normal and Lower Extremity Strength Normal. Normal Exam - Right-Upper Extremity Strength Normal, Lower Extremity Weakness.    Assessment & Plan (Douglas Dykes A. Amazin Pincock MD; 12/21/2017 11:27 AM)  LEFT INGUINAL HERNIA (K40.90) Impression: Discussed repair of left inguinal hernia with mesh. Discussed laparoscopic and open approaches. Discussed the pros and cons and long-term expectations and recovery from each. He has The risk of hernia repair include bleeding, infection, organ injury, bowel injury, bladder injury, nerve injury recurrent hernia, blood clots, worsening of underlying condition, chronic pain, mesh use, open surgery, death, and the need for other operattions. Pt agrees to proceed opted for open left inguinal hernia with mesh  Current Plans You are being scheduled for surgery- Our schedulers will call you.  You should hear from our office's scheduling department within 5  working days about the location, date, and time of surgery. We try to make accommodations for patient's preferences in scheduling surgery, but sometimes the OR schedule or the surgeon's schedule prevents us from making those accommodations.  If you have not heard from our office [phone]) in 5 working days, call the office and ask for your surgeon's nurse.  If you have other questions about your diagnosis, plan, or surgery, call the office and ask for your surgeon's nurse.  Pt Education - Pamphlet Given - Hernia Surgery: discussed with patient and provided information. The anatomy & physiology of the abdominal wall and pelvic floor was discussed. The pathophysiology of hernias in the inguinal and pelvic region was discussed. Natural history risks such as progressive enlargement, pain, incarceration, and strangulation was discussed. Contributors to complications such as smoking, obesity, diabetes, prior surgery, etc were discussed.  I feel the risks of no intervention will lead to serious problems that outweigh the operative risks; therefore, I recommended surgery to reduce and repair the hernia. I explained lapartechniques with need for an open approach. I noted usual use of mesh to patch and/or buttress hernia repair  Risks such as bleeding, infection, abscess, need for further treatment, heart attack, death, chronic pain, mesh use . testicle swelling infertility , organ injury, and other risks were discussed. I noted a good likelihood this will help address the problem. Goals of post-operative recovery were discussed as well. Possibility that this will not correct all symptoms was explained. I stressed the importance of low-impact activity, aggressive pain control, avoiding constipation, & not pushing through pain to minimize risk of post-operative chronic pain or injury. Possibility of reherniation was discussed. We will work to minimize complications.  An educational handout  further explaining the pathology & treatment options was given as well. Questions were answered. The patient expresses understanding & wishes to proceed with surgery.  Pt Education - CCS Mesh education: discussed with patient and provided information.

## 2017-12-31 ENCOUNTER — Other Ambulatory Visit: Payer: Self-pay

## 2017-12-31 ENCOUNTER — Encounter (HOSPITAL_BASED_OUTPATIENT_CLINIC_OR_DEPARTMENT_OTHER): Payer: Self-pay | Admitting: *Deleted

## 2017-12-31 NOTE — Pre-Procedure Instructions (Signed)
To come for EKG and to pick up Ensure pre-surgery drink 10 oz. - to drink by 0930 DOS.

## 2018-01-18 ENCOUNTER — Encounter (HOSPITAL_BASED_OUTPATIENT_CLINIC_OR_DEPARTMENT_OTHER)
Admission: RE | Admit: 2018-01-18 | Discharge: 2018-01-18 | Disposition: A | Discharge status: Home / Self Care | Payer: Self-pay | Source: Ambulatory Visit | Attending: Surgery | Admitting: Surgery

## 2018-01-18 NOTE — Progress Notes (Addendum)
Ensure pre surgery drink given with instructions to complete by 0930 dos, pt verbalized understanding.  EKG reviewed by Dr. Bradley Ferris, will proceed with surgery as scheduled.

## 2018-01-19 ENCOUNTER — Ambulatory Visit (HOSPITAL_BASED_OUTPATIENT_CLINIC_OR_DEPARTMENT_OTHER): Payer: Self-pay | Admitting: Anesthesiology

## 2018-01-19 ENCOUNTER — Encounter (HOSPITAL_BASED_OUTPATIENT_CLINIC_OR_DEPARTMENT_OTHER)
Admission: RE | Disposition: A | Discharge status: Home / Self Care | Payer: Self-pay | Source: Ambulatory Visit | Attending: Surgery

## 2018-01-19 ENCOUNTER — Encounter (HOSPITAL_BASED_OUTPATIENT_CLINIC_OR_DEPARTMENT_OTHER): Payer: Self-pay

## 2018-01-19 ENCOUNTER — Ambulatory Visit (HOSPITAL_BASED_OUTPATIENT_CLINIC_OR_DEPARTMENT_OTHER)
Admission: RE | Admit: 2018-01-19 | Discharge: 2018-01-19 | Disposition: A | Discharge status: Home / Self Care | Payer: Self-pay | Source: Ambulatory Visit | Attending: Surgery | Admitting: Surgery

## 2018-01-19 ENCOUNTER — Other Ambulatory Visit: Payer: Self-pay

## 2018-01-19 DIAGNOSIS — F329 Major depressive disorder, single episode, unspecified: Secondary | ICD-10-CM | POA: Insufficient documentation

## 2018-01-19 DIAGNOSIS — F419 Anxiety disorder, unspecified: Secondary | ICD-10-CM | POA: Insufficient documentation

## 2018-01-19 DIAGNOSIS — I861 Scrotal varices: Secondary | ICD-10-CM | POA: Insufficient documentation

## 2018-01-19 DIAGNOSIS — I10 Essential (primary) hypertension: Secondary | ICD-10-CM | POA: Insufficient documentation

## 2018-01-19 DIAGNOSIS — Z79899 Other long term (current) drug therapy: Secondary | ICD-10-CM | POA: Insufficient documentation

## 2018-01-19 DIAGNOSIS — K4090 Unilateral inguinal hernia, without obstruction or gangrene, not specified as recurrent: Secondary | ICD-10-CM | POA: Insufficient documentation

## 2018-01-19 HISTORY — PX: INGUINAL HERNIA REPAIR: SHX194

## 2018-01-19 HISTORY — PX: INSERTION OF MESH: SHX5868

## 2018-01-19 HISTORY — DX: Other intervertebral disc degeneration, lumbar region without mention of lumbar back pain or lower extremity pain: M51.369

## 2018-01-19 HISTORY — DX: Gastro-esophageal reflux disease without esophagitis: K21.9

## 2018-01-19 HISTORY — DX: Attention-deficit hyperactivity disorder, unspecified type: F90.9

## 2018-01-19 HISTORY — DX: Family history of other specified conditions: Z84.89

## 2018-01-19 HISTORY — DX: Other seasonal allergic rhinitis: J30.2

## 2018-01-19 HISTORY — DX: Other intervertebral disc degeneration, lumbar region: M51.36

## 2018-01-19 HISTORY — DX: Unilateral inguinal hernia, without obstruction or gangrene, not specified as recurrent: K40.90

## 2018-01-19 HISTORY — DX: Other intervertebral disc displacement, lumbar region: M51.26

## 2018-01-19 SURGERY — REPAIR, HERNIA, INGUINAL, ADULT
Anesthesia: General | Site: Groin | Laterality: Left

## 2018-01-19 MED ORDER — FENTANYL CITRATE (PF) 100 MCG/2ML IJ SOLN
50.0000 ug | INTRAMUSCULAR | Status: DC | PRN
  Administered 2018-01-19 (×2): 100 ug via INTRAVENOUS

## 2018-01-19 MED ORDER — GABAPENTIN 300 MG PO CAPS
300.0000 mg | ORAL_CAPSULE | ORAL | Status: AC
  Administered 2018-01-19: 300 mg via ORAL

## 2018-01-19 MED ORDER — ROPIVACAINE HCL 7.5 MG/ML IJ SOLN
INTRAMUSCULAR | Status: DC | PRN
  Administered 2018-01-19: 30 mL

## 2018-01-19 MED ORDER — DEXAMETHASONE SODIUM PHOSPHATE 10 MG/ML IJ SOLN
INTRAMUSCULAR | Status: AC
  Filled 2018-01-19: qty 1

## 2018-01-19 MED ORDER — DEXTROSE 5 % IV SOLN: 3.0000 g | INTRAVENOUS | Status: DC

## 2018-01-19 MED ORDER — OXYCODONE HCL 5 MG PO TABS
5.0000 mg | ORAL_TABLET | Freq: Once | ORAL | Status: AC | PRN
  Administered 2018-01-19: 5 mg via ORAL

## 2018-01-19 MED ORDER — SCOPOLAMINE 1 MG/3DAYS TD PT72: 1.0000 | MEDICATED_PATCH | Freq: Once | TRANSDERMAL | Status: DC | PRN

## 2018-01-19 MED ORDER — SUGAMMADEX SODIUM 200 MG/2ML IV SOLN
INTRAVENOUS | Status: DC | PRN
  Administered 2018-01-19: 200 mg via INTRAVENOUS

## 2018-01-19 MED ORDER — ACETAMINOPHEN 160 MG/5ML PO SOLN: 325.0000 mg | ORAL | Status: DC | PRN

## 2018-01-19 MED ORDER — CEFAZOLIN SODIUM-DEXTROSE 2-4 GM/100ML-% IV SOLN
2.0000 g | INTRAVENOUS | Status: AC
  Administered 2018-01-19: 2 g via INTRAVENOUS

## 2018-01-19 MED ORDER — ACETAMINOPHEN 500 MG PO TABS
1000.0000 mg | ORAL_TABLET | ORAL | Status: AC
  Administered 2018-01-19: 1000 mg via ORAL

## 2018-01-19 MED ORDER — SUCCINYLCHOLINE CHLORIDE 20 MG/ML IJ SOLN
INTRAMUSCULAR | Status: DC | PRN
  Administered 2018-01-19: 120 mg via INTRAVENOUS

## 2018-01-19 MED ORDER — BUPIVACAINE-EPINEPHRINE 0.25% -1:200000 IJ SOLN
INTRAMUSCULAR | Status: DC | PRN
  Administered 2018-01-19: 10 mL

## 2018-01-19 MED ORDER — CELECOXIB 200 MG PO CAPS
ORAL_CAPSULE | ORAL | Status: AC
  Filled 2018-01-19: qty 1

## 2018-01-19 MED ORDER — ACETAMINOPHEN 325 MG PO TABS: 325.0000 mg | ORAL_TABLET | ORAL | Status: DC | PRN

## 2018-01-19 MED ORDER — FENTANYL CITRATE (PF) 100 MCG/2ML IJ SOLN
25.0000 ug | INTRAMUSCULAR | Status: DC | PRN
  Administered 2018-01-19: 25 ug via INTRAVENOUS

## 2018-01-19 MED ORDER — ONDANSETRON HCL 4 MG/2ML IJ SOLN
INTRAMUSCULAR | Status: AC
  Filled 2018-01-19: qty 2

## 2018-01-19 MED ORDER — GABAPENTIN 300 MG PO CAPS
ORAL_CAPSULE | ORAL | Status: AC
  Filled 2018-01-19: qty 1

## 2018-01-19 MED ORDER — ROCURONIUM BROMIDE 100 MG/10ML IV SOLN
INTRAVENOUS | Status: DC | PRN
  Administered 2018-01-19: 40 mg via INTRAVENOUS

## 2018-01-19 MED ORDER — SUGAMMADEX SODIUM 500 MG/5ML IV SOLN
INTRAVENOUS | Status: AC
  Filled 2018-01-19: qty 5

## 2018-01-19 MED ORDER — OXYCODONE HCL 5 MG PO TABS
ORAL_TABLET | ORAL | Status: AC
  Filled 2018-01-19: qty 1

## 2018-01-19 MED ORDER — LIDOCAINE HCL (CARDIAC) PF 100 MG/5ML IV SOSY
PREFILLED_SYRINGE | INTRAVENOUS | Status: DC | PRN
  Administered 2018-01-19: 100 mg via INTRAVENOUS

## 2018-01-19 MED ORDER — PHENYLEPHRINE HCL 10 MG/ML IJ SOLN
INTRAMUSCULAR | Status: DC | PRN
  Administered 2018-01-19: 120 ug via INTRAVENOUS
  Administered 2018-01-19: 80 ug via INTRAVENOUS
  Administered 2018-01-19: 120 ug via INTRAVENOUS
  Administered 2018-01-19 (×2): 80 ug via INTRAVENOUS

## 2018-01-19 MED ORDER — MIDAZOLAM HCL 2 MG/2ML IJ SOLN
INTRAMUSCULAR | Status: AC
  Filled 2018-01-19: qty 2

## 2018-01-19 MED ORDER — CEFAZOLIN SODIUM-DEXTROSE 2-4 GM/100ML-% IV SOLN
INTRAVENOUS | Status: AC
  Filled 2018-01-19: qty 100

## 2018-01-19 MED ORDER — ONDANSETRON HCL 4 MG/2ML IJ SOLN: 4.0000 mg | Freq: Once | INTRAMUSCULAR | Status: DC | PRN

## 2018-01-19 MED ORDER — MEPERIDINE HCL 25 MG/ML IJ SOLN
6.2500 mg | INTRAMUSCULAR | Status: DC | PRN
Start: 2018-01-19 — End: 2018-01-19

## 2018-01-19 MED ORDER — ACETAMINOPHEN 500 MG PO TABS
ORAL_TABLET | ORAL | Status: AC
  Filled 2018-01-19: qty 2

## 2018-01-19 MED ORDER — CHLORHEXIDINE GLUCONATE CLOTH 2 % EX PADS: 6.0000 | MEDICATED_PAD | Freq: Once | CUTANEOUS | Status: DC

## 2018-01-19 MED ORDER — CELECOXIB 200 MG PO CAPS
200.0000 mg | ORAL_CAPSULE | ORAL | Status: AC
  Administered 2018-01-19: 200 mg via ORAL

## 2018-01-19 MED ORDER — ONDANSETRON HCL 4 MG/2ML IJ SOLN
INTRAMUSCULAR | Status: DC | PRN
Start: 2018-01-19 — End: 2018-01-19
  Administered 2018-01-19: 4 mg via INTRAVENOUS

## 2018-01-19 MED ORDER — OXYCODONE HCL 5 MG/5ML PO SOLN: 5.0000 mg | Freq: Once | ORAL | Status: AC | PRN

## 2018-01-19 MED ORDER — FENTANYL CITRATE (PF) 100 MCG/2ML IJ SOLN
INTRAMUSCULAR | Status: AC
  Filled 2018-01-19: qty 2

## 2018-01-19 MED ORDER — PROPOFOL 10 MG/ML IV BOLUS
INTRAVENOUS | Status: DC | PRN
  Administered 2018-01-19: 200 mg via INTRAVENOUS

## 2018-01-19 MED ORDER — BUPIVACAINE-EPINEPHRINE (PF) 0.25% -1:200000 IJ SOLN
INTRAMUSCULAR | Status: AC
  Filled 2018-01-19: qty 30

## 2018-01-19 MED ORDER — OXYCODONE HCL 5 MG PO TABS: 5.0000 mg | ORAL_TABLET | Freq: Four times a day (QID) | ORAL | 0 refills | Status: DC | PRN

## 2018-01-19 MED ORDER — DEXAMETHASONE SODIUM PHOSPHATE 4 MG/ML IJ SOLN
INTRAMUSCULAR | Status: DC | PRN
  Administered 2018-01-19: 10 mg via INTRAVENOUS

## 2018-01-19 MED ORDER — PROPOFOL 10 MG/ML IV BOLUS
INTRAVENOUS | Status: AC
  Filled 2018-01-19: qty 40

## 2018-01-19 MED ORDER — MIDAZOLAM HCL 2 MG/2ML IJ SOLN
1.0000 mg | INTRAMUSCULAR | Status: DC | PRN
  Administered 2018-01-19 (×2): 2 mg via INTRAVENOUS

## 2018-01-19 MED ORDER — MIDAZOLAM HCL 2 MG/2ML IJ SOLN
INTRAMUSCULAR | Status: AC
Start: 2018-01-19 — End: 2018-01-19
  Filled 2018-01-19: qty 2

## 2018-01-19 MED ORDER — LIDOCAINE HCL (CARDIAC) PF 100 MG/5ML IV SOSY
PREFILLED_SYRINGE | INTRAVENOUS | Status: AC
  Filled 2018-01-19: qty 5

## 2018-01-19 MED ORDER — IBUPROFEN 800 MG PO TABS: 800.0000 mg | ORAL_TABLET | Freq: Three times a day (TID) | ORAL | 0 refills | Status: DC | PRN

## 2018-01-19 MED ORDER — ENSURE PRE-SURGERY PO LIQD: 296.0000 mL | Freq: Once | ORAL | Status: DC

## 2018-01-19 MED ORDER — LACTATED RINGERS IV SOLN
INTRAVENOUS | Status: DC
  Administered 2018-01-19 (×2): via INTRAVENOUS

## 2018-01-19 SURGICAL SUPPLY — 51 items
BLADE CLIPPER SURG (BLADE) ×3 IMPLANT
BLADE SURG 15 STRL LF DISP TIS (BLADE) ×1 IMPLANT
BLADE SURG 15 STRL SS (BLADE) ×2
CANISTER SUCT 1200ML W/VALVE (MISCELLANEOUS) IMPLANT
CHLORAPREP W/TINT 26ML (MISCELLANEOUS) ×3 IMPLANT
COVER BACK TABLE 60X90IN (DRAPES) ×3 IMPLANT
COVER MAYO STAND STRL (DRAPES) ×3 IMPLANT
DECANTER SPIKE VIAL GLASS SM (MISCELLANEOUS) IMPLANT
DERMABOND ADVANCED (GAUZE/BANDAGES/DRESSINGS) ×2
DERMABOND ADVANCED .7 DNX12 (GAUZE/BANDAGES/DRESSINGS) ×1 IMPLANT
DRAIN PENROSE 1/2X12 LTX STRL (WOUND CARE) ×3 IMPLANT
DRAPE LAPAROTOMY TRNSV 102X78 (DRAPE) ×3 IMPLANT
DRAPE UTILITY XL STRL (DRAPES) ×3 IMPLANT
ELECT COATED BLADE 2.86 ST (ELECTRODE) ×3 IMPLANT
ELECT REM PT RETURN 9FT ADLT (ELECTROSURGICAL) ×3
ELECTRODE REM PT RTRN 9FT ADLT (ELECTROSURGICAL) ×1 IMPLANT
GAUZE SPONGE 4X4 12PLY STRL LF (GAUZE/BANDAGES/DRESSINGS) IMPLANT
GAUZE SPONGE 4X4 16PLY XRAY LF (GAUZE/BANDAGES/DRESSINGS) IMPLANT
GLOVE BIOGEL PI IND STRL 7.5 (GLOVE) ×1 IMPLANT
GLOVE BIOGEL PI IND STRL 8 (GLOVE) ×1 IMPLANT
GLOVE BIOGEL PI INDICATOR 7.5 (GLOVE) ×2
GLOVE BIOGEL PI INDICATOR 8 (GLOVE) ×2
GLOVE ECLIPSE 8.0 STRL XLNG CF (GLOVE) ×3 IMPLANT
GLOVE SURG SS PI 7.0 STRL IVOR (GLOVE) ×3 IMPLANT
GOWN STRL REUS W/ TWL LRG LVL3 (GOWN DISPOSABLE) ×1 IMPLANT
GOWN STRL REUS W/ TWL XL LVL3 (GOWN DISPOSABLE) ×1 IMPLANT
GOWN STRL REUS W/TWL LRG LVL3 (GOWN DISPOSABLE) ×2
GOWN STRL REUS W/TWL XL LVL3 (GOWN DISPOSABLE) ×2
MESH HERNIA SYS ULTRAPRO LRG (Mesh General) ×3 IMPLANT
NEEDLE HYPO 25X1 1.5 SAFETY (NEEDLE) ×3 IMPLANT
NS IRRIG 1000ML POUR BTL (IV SOLUTION) ×3 IMPLANT
PACK BASIN DAY SURGERY FS (CUSTOM PROCEDURE TRAY) ×3 IMPLANT
PENCIL BUTTON HOLSTER BLD 10FT (ELECTRODE) ×3 IMPLANT
SLEEVE SCD COMPRESS KNEE MED (MISCELLANEOUS) ×3 IMPLANT
SPONGE LAP 4X18 X RAY DECT (DISPOSABLE) ×3 IMPLANT
SUT MON AB 4-0 PC3 18 (SUTURE) ×3 IMPLANT
SUT NOVA 0 T19/GS 22DT (SUTURE) ×9 IMPLANT
SUT VIC AB 0 SH 27 (SUTURE) ×3 IMPLANT
SUT VIC AB 2-0 SH 27 (SUTURE) ×2
SUT VIC AB 2-0 SH 27XBRD (SUTURE) ×1 IMPLANT
SUT VIC AB 3-0 54X BRD REEL (SUTURE) IMPLANT
SUT VIC AB 3-0 BRD 54 (SUTURE)
SUT VICRYL 3-0 CR8 SH (SUTURE) ×3 IMPLANT
SUT VICRYL AB 2 0 TIE (SUTURE) IMPLANT
SUT VICRYL AB 2 0 TIES (SUTURE)
SYR CONTROL 10ML LL (SYRINGE) ×3 IMPLANT
TOWEL OR 17X24 6PK STRL BLUE (TOWEL DISPOSABLE) ×3 IMPLANT
TOWEL OR NON WOVEN STRL DISP B (DISPOSABLE) ×3 IMPLANT
TUBE CONNECTING 20'X1/4 (TUBING)
TUBE CONNECTING 20X1/4 (TUBING) IMPLANT
YANKAUER SUCT BULB TIP NO VENT (SUCTIONS) IMPLANT

## 2018-01-19 NOTE — Progress Notes (Signed)
Assisted Dr. Oddono with left, ultrasound guided, transabdominal plane block. Side rails up, monitors on throughout procedure. See vital signs in flow sheet. Tolerated Procedure well. 

## 2018-01-19 NOTE — Anesthesia Procedure Notes (Addendum)
Anesthesia Regional Block: TAP block   Pre-Anesthetic Checklist: ,, timeout performed, Correct Patient, Correct Site, Correct Laterality, Correct Procedure, Correct Position, site marked, Risks and benefits discussed,  Surgical consent,  Pre-op evaluation,  At surgeon's request and post-op pain management  Laterality: Left  Prep: chloraprep       Needles:  Injection technique: Single-shot  Needle Type: Echogenic Stimulator Needle     Needle Length: 5cm  Needle Gauge: 22     Additional Needles:   Procedures:, nerve stimulator,,, ultrasound used (permanent image in chart),,,,  Narrative:  Start time: 01/19/2018 10:53 AM End time: 01/19/2018 11:00 AM Injection made incrementally with aspirations every 5 mL.  Performed by: Personally  Anesthesiologist: Bethena Midget, MD  Additional Notes: Functioning IV was confirmed and monitors were applied.  A 50mm 22ga Arrow echogenic stimulator needle was used. Sterile prep and drape,hand hygiene and sterile gloves were used. Ultrasound guidance: relevant anatomy identified, needle position confirmed, local anesthetic spread visualized around nerve(s)., vascular puncture avoided.  Image printed for medical record. Negative aspiration and negative test dose prior to incremental administration of local anesthetic. The patient tolerated the procedure well.

## 2018-01-19 NOTE — Op Note (Signed)
Preoperative diagnosis: Left inguinal hernia reducible  Postoperative diagnosis: Same  Procedure: Repair of left inguinal hernia with mesh  Surgeon: Erroll Luna, MD  Anesthesia: General with tap block and local  EBL: 10 cc  Specimen: None  IV fluids: Per anesthesia record  Indications for procedure: The patient is a 32 year old male with an enlarging symptomatic painful left inguinal hernia.  He presents for repair.  Laparoscopic and open options discussed.  The use of mesh and long-term expectations and complications discussed.The risk of hernia repair include bleeding,  Infection,   Recurrence of the hernia,  Mesh use, chronic pain,  Organ injury,testicle pain/loss,   Bowel injury, infertility,  Bladder injury,   nerve injury with numbness around the incision,  Death,  and worsening of preexisting  medical problems.  The alternatives to surgery have been discussed as well..  Long term expectations of both operative and non operative treatments have been discussed.   The patient agrees to proceed.  Description of procedure: The patient was met in the holding area.  The left groin was marked as the correct side.  Tap block was placed by anesthesia.  He was taken back to the operating room.  He was placed upon the operating room table.  After induction of general anesthesia, left groin was prepped and draped in sterile fashion.  Timeout was done.  Proper patient, side and procedure were reviewed.  A left inguinal incision was made in the inguinal crease.  Dissection was carried down through Scarpa's fascia.  The superficial epigastric vessels were divided between 2-0 Vicryl ties.  Dissection was carried down to the aponeurosis of the external oblique.  The fibers were opened through the external ring with scissors.  Cord structures were encircled with 1/4 inch Penrose drain.  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  The patient a very large direct hernia.   This was reduced back into the preperitoneal space.  A large piece of Prolene hernia system was inserted into the defect.  Inner leaflet was placed in the preperitoneal space and the onlay was placed onto the floor of the inguinal canal.  A slit was cut for the cord structures.  The mesh was secured to the shelving edge of the inguinal ligament to the conjoined tendon with 2-0 Novafil.  The ilioinguinal nerve was tethered under the mesh was divided as it exited the muscle.  The cord structures were encircled with mesh and secured with 2-0 nylon.  There is ample room for the cord to exit.  Hemostasis achieved.  The aponeurosis of the external oblique was closed with 2-0 Vicryl.  3-0 Vicryl was used to approximate Scarpa's fascia and 4-0 Monocryl was used to close the skin in a subcuticular fashion.  Dermabond applied.  All final counts found to be correct.  The patient was awoke extubated taken recovery in satisfactory condition.

## 2018-01-19 NOTE — Interval H&P Note (Signed)
History and Physical Interval Note:  01/19/2018 11:00 AM  Douglas Lang  has presented today for surgery, with the diagnosis of LEFT INGUINAL HERNIA  The various methods of treatment have been discussed with the patient and family. After consideration of risks, benefits and other options for treatment, the patient has consented to  Procedure(s): LEFT INGUINAL HERNIA REPAIR WITH MESH (Left) INSERTION OF MESH (Left) as a surgical intervention .  The patient's history has been reviewed, patient examined, no change in status, stable for surgery.  I have reviewed the patient's chart and labs.  Questions were answered to the patient's satisfaction.     Hydia Copelin A Darrion Wyszynski

## 2018-01-19 NOTE — Anesthesia Postprocedure Evaluation (Signed)
Anesthesia Post Note  Patient: Douglas Lang  Procedure(s) Performed: LEFT INGUINAL HERNIA REPAIR WITH MESH (Left Groin) INSERTION OF MESH (Left Groin)     Patient location during evaluation: PACU Anesthesia Type: General Level of consciousness: awake and alert Pain management: pain level controlled Vital Signs Assessment: post-procedure vital signs reviewed and stable Respiratory status: spontaneous breathing, nonlabored ventilation, respiratory function stable and patient connected to nasal cannula oxygen Cardiovascular status: blood pressure returned to baseline and stable Postop Assessment: no apparent nausea or vomiting Anesthetic complications: no    Last Vitals:  Vitals:   01/19/18 1400 01/19/18 1430  BP: 127/81 121/77  Pulse: 91 86  Resp: 13 16  Temp:  36.6 C  SpO2: 96% 96%    Last Pain:  Vitals:   01/19/18 1430  TempSrc:   PainSc: 3                  Tegh Franek

## 2018-01-19 NOTE — Transfer of Care (Signed)
Immediate Anesthesia Transfer of Care Note  Patient: Douglas Lang  Procedure(s) Performed: LEFT INGUINAL HERNIA REPAIR WITH MESH (Left Groin) INSERTION OF MESH (Left Groin)  Patient Location: PACU  Anesthesia Type:General  Level of Consciousness: awake, alert  and oriented  Airway & Oxygen Therapy: Patient Spontanous Breathing and Patient connected to face mask oxygen  Post-op Assessment: Report given to RN and Post -op Vital signs reviewed and stable  Post vital signs: Reviewed and stable  Last Vitals:  Vitals Value Taken Time  BP 125/79 01/19/2018 12:58 PM  Temp    Pulse 88 01/19/2018  1:00 PM  Resp 16 01/19/2018  1:00 PM  SpO2 100 % 01/19/2018  1:00 PM  Vitals shown include unvalidated device data.  Last Pain:  Vitals:   01/19/18 1033  TempSrc: Oral  PainSc: 0-No pain         Complications: No apparent anesthesia complications

## 2018-01-19 NOTE — Anesthesia Procedure Notes (Signed)
Procedure Name: Intubation Performed by: Verita Lamb, CRNA Pre-anesthesia Checklist: Patient identified, Suction available, Emergency Drugs available, Patient being monitored and Timeout performed Patient Re-evaluated:Patient Re-evaluated prior to induction Oxygen Delivery Method: Circle system utilized Preoxygenation: Pre-oxygenation with 100% oxygen Induction Type: IV induction LMA: LMA inserted LMA Size: 4.0 Laryngoscope Size: Mac and 3 Grade View: Grade I Tube type: Oral Number of attempts: 1 Airway Equipment and Method: Stylet Placement Confirmation: ETT inserted through vocal cords under direct vision,  positive ETCO2,  CO2 detector and breath sounds checked- equal and bilateral Secured at: 22 cm Tube secured with: Tape Dental Injury: Teeth and Oropharynx as per pre-operative assessment

## 2018-01-19 NOTE — Anesthesia Preprocedure Evaluation (Deleted)

## 2018-01-19 NOTE — Anesthesia Preprocedure Evaluation (Signed)
Anesthesia Evaluation  Patient identified by MRN, date of birth, ID band Patient awake    Reviewed: Allergy & Precautions, H&P , NPO status , Patient's Chart, lab work & pertinent test results, reviewed documented beta blocker date and time   Airway Mallampati: II  TM Distance: >3 FB Neck ROM: full    Dental no notable dental hx.    Pulmonary neg pulmonary ROS,    Pulmonary exam normal breath sounds clear to auscultation       Cardiovascular Exercise Tolerance: Good hypertension, Pt. on medications  Rhythm:regular Rate:Normal     Neuro/Psych Anxiety Depression  Neuromuscular disease    GI/Hepatic negative GI ROS, Neg liver ROS,   Endo/Other  negative endocrine ROS  Renal/GU negative Renal ROS  negative genitourinary   Musculoskeletal   Abdominal   Peds negative pediatric ROS (+)  Hematology negative hematology ROS (+)   Anesthesia Other Findings   Reproductive/Obstetrics negative OB ROS                            Anesthesia Physical Anesthesia Plan  ASA: II  Anesthesia Plan: General   Post-op Pain Management: GA combined w/ Regional for post-op pain   Induction: Intravenous  PONV Risk Score and Plan: 2 and Dexamethasone, Ondansetron and Treatment may vary due to age or medical condition  Airway Management Planned: Oral ETT and LMA  Additional Equipment:   Intra-op Plan:   Post-operative Plan: Extubation in OR  Informed Consent: I have reviewed the patients History and Physical, chart, labs and discussed the procedure including the risks, benefits and alternatives for the proposed anesthesia with the patient or authorized representative who has indicated his/her understanding and acceptance.   Dental Advisory Given  Plan Discussed with: CRNA, Anesthesiologist and Surgeon  Anesthesia Plan Comments: (  )       Anesthesia Quick Evaluation

## 2018-01-19 NOTE — Discharge Instructions (Signed)
[facility] _______[facility]  UMBILICAL OR INGUINAL HERNIA REPAIR: POST OP INSTRUCTIONS  Always review your discharge instruction sheet given to you by the facility where your surgery was performed. IF YOU HAVE DISABILITY OR FAMILY LEAVE FORMS, YOU MUST BRING THEM TO THE OFFICE FOR PROCESSING.   DO NOT GIVE THEM TO YOUR DOCTOR.  1. A  prescription for pain medication may be given to you upon discharge.  Take your pain medication as prescribed, if needed.  If narcotic pain medicine is not needed, then you may take acetaminophen (Tylenol) or ibuprofen (Advil) as needed. Last Dose of Tylenol  taken at 10:37AM 2. Take your usually prescribed medications unless otherwise directed. If you need a refill on your pain medication, please contact your pharmacy.  They will contact our office to request authorization. Prescriptions will not be filled after 5 pm or on week-ends. 3. You should follow a light diet the first 24 hours after arrival home, such as soup and crackers, etc.  Be sure to include lots of fluids daily.  Resume your normal diet the day after surgery. 4.Most patients will experience some swelling and bruising around the umbilicus or in the groin and scrotum.  Ice packs and reclining will help.  Swelling and bruising can take several days to resolve.  6. It is common to experience some constipation if taking pain medication after surgery.  Increasing fluid intake and taking a stool softener (such as Colace) will usually help or prevent this problem from occurring.  A mild laxative (Milk of Magnesia or Miralax) should be taken according to package directions if there are no bowel movements after 48 hours. 7. Unless discharge instructions indicate otherwise, you may remove your bandages 24-48 hours after surgery, and you may shower at that time.  You may have steri-strips (small skin tapes) in place directly over the incision.  These strips should be left on the skin for 7-10 days.   If your surgeon used skin glue on the incision, you may shower in 24 hours.  The glue will flake off over the next 2-3 weeks.  Any sutures or staples will be removed at the office during your follow-up visit. 8. ACTIVITIES:  You may resume regular (light) daily activities beginning the next day--such as daily self-care, walking, climbing stairs--gradually increasing activities as tolerated.  You may have sexual intercourse when it is comfortable.  Refrain from any heavy lifting or straining until approved by your doctor.  a.You may drive when you are no longer taking prescription pain medication, you can comfortably wear a seatbelt, and you can safely maneuver your car and apply brakes. b.RETURN TO WORK:   _____________________________________________  9.You should see your doctor in the office for a follow-up appointment approximately 2-3 weeks after your surgery.  Make sure that you call for this appointment within a day or two after you arrive home to insure a convenient appointment time. 10.OTHER INSTRUCTIONS: _________________________    _____________________________________  WHEN TO CALL YOUR DOCTOR: 1. Fever over 101.0 2. Inability to urinate 3. Nausea and/or vomiting 4. Extreme swelling or bruising 5. Continued bleeding from incision. 6. Increased pain, redness, or drainage from the incision  The clinic staff is available to answer your questions during regular business hours.  Please dont hesitate to call and ask to speak to one of the nurses for clinical concerns.  If you have a medical emergency, go to the nearest emergency room or call 911.  A surgeon from [facility] is  always on call at the hospital   [address] [place], [place], [place]  [address] ?  [address], [place], [place]   [address] [phone] ? [phone] ? FAX [phone] Web site: [email or web address]   Post Anesthesia Home Care Instructions  Activity: Get plenty of  rest for the remainder of the day. A responsible individual must stay with you for 24 hours following the procedure.  For the next 24 hours, DO NOT: -Drive a car -Paediatric nurse -Drink alcoholic beverages -Take any medication unless instructed by your physician -Make any legal decisions or sign important papers.  Meals: Start with liquid foods such as gelatin or soup. Progress to regular foods as tolerated. Avoid greasy, spicy, heavy foods. If nausea and/or vomiting occur, drink only clear liquids until the nausea and/or vomiting subsides. Call your physician if vomiting continues.  Special Instructions/Symptoms: Your throat may feel dry or sore from the anesthesia or the breathing tube placed in your throat during surgery. If this causes discomfort, gargle with warm salt water. The discomfort should disappear within 24 hours.  If you had a scopolamine patch placed behind your ear for the management of post- operative nausea and/or vomiting:  1. The medication in the patch is effective for 72 hours, after which it should be removed.  Wrap patch in a tissue and discard in the trash. Wash hands thoroughly with soap and water. 2. You may remove the patch earlier than 72 hours if you experience unpleasant side effects which may include dry mouth, dizziness or visual disturbances. 3. Avoid touching the patch. Wash your hands with soap and water after contact with the patch.

## 2018-01-20 ENCOUNTER — Encounter (HOSPITAL_BASED_OUTPATIENT_CLINIC_OR_DEPARTMENT_OTHER): Payer: Self-pay | Admitting: Surgery

## 2018-01-28 ENCOUNTER — Encounter (HOSPITAL_BASED_OUTPATIENT_CLINIC_OR_DEPARTMENT_OTHER): Payer: Self-pay | Admitting: Surgery

## 2018-02-24 ENCOUNTER — Other Ambulatory Visit: Payer: Self-pay | Admitting: Sports Medicine

## 2018-02-24 DIAGNOSIS — R1013 Epigastric pain: Secondary | ICD-10-CM

## 2018-04-27 ENCOUNTER — Other Ambulatory Visit: Payer: Self-pay | Admitting: Sports Medicine

## 2018-04-27 DIAGNOSIS — I10 Essential (primary) hypertension: Secondary | ICD-10-CM

## 2019-05-19 ENCOUNTER — Other Ambulatory Visit: Payer: Self-pay | Admitting: Sports Medicine

## 2019-05-19 DIAGNOSIS — I10 Essential (primary) hypertension: Secondary | ICD-10-CM

## 2019-07-03 ENCOUNTER — Other Ambulatory Visit: Payer: Self-pay | Admitting: Sports Medicine

## 2019-07-03 DIAGNOSIS — I10 Essential (primary) hypertension: Secondary | ICD-10-CM

## 2019-07-08 ENCOUNTER — Emergency Department
Admission: EM | Admit: 2019-07-08 | Discharge: 2019-07-08 | Disposition: A | Discharge status: Home / Self Care | Payer: Self-pay | Source: Home / Self Care

## 2019-07-08 ENCOUNTER — Encounter: Payer: Self-pay | Admitting: Emergency Medicine

## 2019-07-08 ENCOUNTER — Other Ambulatory Visit: Payer: Self-pay

## 2019-07-08 DIAGNOSIS — Z760 Encounter for issue of repeat prescription: Secondary | ICD-10-CM

## 2019-07-08 DIAGNOSIS — I10 Essential (primary) hypertension: Secondary | ICD-10-CM

## 2019-07-08 MED ORDER — LISINOPRIL 20 MG PO TABS: 20.0000 mg | ORAL_TABLET | Freq: Every day | ORAL | 0 refills | Status: DC

## 2019-07-08 NOTE — Discharge Instructions (Signed)
°  It is very important to schedule a follow up appointment with your family doctor in the next 1-2 weeks for ongoing healthcare needs and maintenance of your blood pressure. Typically if your blood pressure is well controlled, your kidneys are working well (determined by routine bloodwork) and you are not having any side effects, you should be able to get a one year supply of refills for your medication.

## 2019-07-08 NOTE — ED Triage Notes (Signed)
Patient states his lisinopril rx has kept his BP wnl, although he does have the tickle/cough.

## 2019-07-08 NOTE — ED Triage Notes (Signed)
Patient has been without his antihypertension rx for 6 days; the pharmacy will not refill it without authorization. Occasional headache he attributes to stress.  Has not travelled outside [place] past 4 weeks.

## 2019-07-09 NOTE — ED Provider Notes (Signed)
[facility]    CSN: [id number] Arrival date & time: 07/08/19  1749      History   Chief Complaint Chief Complaint  Patient presents with   Hypertension    HPI Douglas Lang is a 33 y.o. male.   HPI Douglas Lang is a 33 y.o. male presenting to UC with request for a refill of his blood pressure medication. He has been out of it for about 6 days. He called his PCP but has not seen his PCP in about 1.62yrs so he could not get a refill until he f/u for another appointment. He has been having mild generalized HAs and wonders if that is due to his BP but also reports increased stress recently.  He reports BPs of 140-150s/90s-100.  Denies HA at this time. No dizziness, blurred vision, chest pain or SOB.   Past Medical History:  Diagnosis Date   ADHD (attention deficit hyperactivity disorder)    no current med.   Anxiety    Bulging lumbar disc    Family history of adverse reaction to anesthesia    pt's mother and maternal grandfather have hx. of being hard to wake up post-op   GERD (gastroesophageal reflux disease)    no current med.   Hypertension    states under control with med., has been on med. since 2018   Inguinal hernia, left 12/2017   Seasonal allergies     Patient Active Problem List   Diagnosis Date Noted   Left inguinal hernia 12/09/2017   Dyspepsia 06/23/2016   Transaminitis 03/21/2016   Depression 05/16/2015   Routine general medical examination at a health care facility 05/16/2015   Essential hypertension, benign 04/02/2015   Left lumbar radiculopathy 03/05/2015   Left cervical radiculopathy 03/05/2015    Past Surgical History:  Procedure Laterality Date   INGUINAL HERNIA REPAIR Left 01/19/2018   Procedure: LEFT INGUINAL HERNIA REPAIR WITH MESH;  Surgeon: Erroll Luna, MD;  Location: [facility];  Service: General;  Laterality: Left;   INSERTION OF MESH Left 01/19/2018   Procedure: INSERTION OF MESH;   Surgeon: Erroll Luna, MD;  Location: [facility];  Service: General;  Laterality: Left;   NO PAST SURGERIES         Home Medications    Prior to Admission medications   Medication Sig Start Date End Date Taking? Authorizing Provider  cetirizine (ZYRTEC) 10 MG tablet Take 10 mg by mouth daily.    [provider]  ibuprofen (ADVIL,MOTRIN) 800 MG tablet Take 1 tablet (800 mg total) by mouth every 8 (eight) hours as needed. 01/19/18   Cornett, Marcello Moores, MD  lisinopril (ZESTRIL) 20 MG tablet Take 1 tablet (20 mg total) by mouth daily. 07/08/19   Noe Gens, PA-C  NON FORMULARY Take by mouth 2 (two) times daily. [facility]    [provider]  OVER THE COUNTER MEDICATION Take by mouth 2 (two) times daily. NUTRI-CALM    [provider]  oxyCODONE (OXY IR/ROXICODONE) 5 MG immediate release tablet Take 1 tablet (5 mg total) by mouth every 6 (six) hours as needed for severe pain. 01/19/18   Erroll Luna, MD    Family History Family History  Problem Relation Age of Onset   Allergies Mother    Fibromyalgia Mother    Anesthesia problems Mother        hard to wake up post-op   Hypertension Father    ALS Father    Anesthesia problems Maternal Grandfather  hard to wake up p ost-op    Social History Social History   Tobacco Use   Smoking status: Never Smoker   Smokeless tobacco: Never Used  Substance Use Topics   Alcohol use: Yes    Comment: occasionally   Drug use: No     Allergies   Patient has no known allergies.   Review of Systems Review of Systems  Constitutional: Negative for chills and fever.  HENT: Negative for congestion, sinus pressure and sinus pain.   Eyes: Negative for visual disturbance.  Cardiovascular: Negative for chest pain and palpitations.  Gastrointestinal: Negative for diarrhea, nausea and vomiting.  Musculoskeletal: Negative for arthralgias, myalgias, neck pain and neck stiffness.    Neurological: Positive for headaches. Negative for dizziness and light-headedness.     Physical Exam Triage Vital Signs ED Triage Vitals  Enc Vitals Group     BP 07/08/19 1801 (!) 150/98     Pulse Rate 07/08/19 1801 83     Resp 07/08/19 1801 18     Temp 07/08/19 1801 98.3 F (36.8 C)     Temp Source 07/08/19 1801 Oral     SpO2 07/08/19 1801 98 %     Weight 07/08/19 1808 222 lb (100.7 kg)     Height 07/08/19 1808 5\' 9"  (1.753 m)     Head Circumference --      Peak Flow --      Pain Score 07/08/19 1808 0     Pain Loc --      Pain Edu? --      Excl. in GC? --    No data found.  Updated Vital Signs BP (!) 150/98 (BP Location: Right Arm)    Pulse 83    Temp 98.3 F (36.8 C) (Oral)    Resp 18    Ht 5\' 9"  (1.753 m)    Wt 222 lb (100.7 kg)    SpO2 98%    BMI 32.78 kg/m   Visual Acuity Right Eye Distance:   Left Eye Distance:   Bilateral Distance:    Right Eye Near:   Left Eye Near:    Bilateral Near:     Physical Exam Vitals signs and nursing note reviewed.  Constitutional:      Appearance: Normal appearance. He is well-developed.  HENT:     Head: Normocephalic and atraumatic.     Right Ear: Tympanic membrane normal.     Left Ear: Tympanic membrane normal.     Nose: Nose normal.     Mouth/Throat:     Mouth: Mucous membranes are moist.  Eyes:     Extraocular Movements: Extraocular movements intact.     Pupils: Pupils are equal, round, and reactive to light.  Neck:     Musculoskeletal: Normal range of motion.  Cardiovascular:     Rate and Rhythm: Normal rate and regular rhythm.  Pulmonary:     Effort: Pulmonary effort is normal. No respiratory distress.     Breath sounds: Normal breath sounds.  Abdominal:     General: There is no distension.     Palpations: Abdomen is soft.     Tenderness: There is no abdominal tenderness.  Musculoskeletal: Normal range of motion.  Skin:    General: Skin is warm and dry.     Capillary Refill: Capillary refill takes less  than 2 seconds.  Neurological:     General: No focal deficit present.     Mental Status: He is alert and oriented to person, place, and  time.     Cranial Nerves: No cranial nerve deficit.     Sensory: No sensory deficit.     Gait: Gait normal.  Psychiatric:        Behavior: Behavior normal.      UC Treatments / Results  Labs (all labs ordered are listed, but only abnormal results are displayed) Labs Reviewed - No data to display  EKG   Radiology No results found.  Procedures Procedures (including critical care time)  Medications Ordered in UC Medications - No data to display  Initial Impression / Assessment and Plan / UC Course  I have reviewed the triage vital signs and the nursing notes.  Pertinent labs & imaging results that were available during my care of the patient were reviewed by me and considered in my medical decision making (see chart for details).     Pt appears well, NAD.  Normal neuro exam Medication refilled, stressed importance of f/u with PCP in 1-2 weeks for ongoing maintenance AVS provided  Final Clinical Impressions(s) / UC Diagnoses   Final diagnoses:  Uncontrolled hypertension  Medication refill     Discharge Instructions      It is very important to schedule a follow up appointment with your family doctor in the next 1-2 weeks for ongoing healthcare needs and maintenance of your blood pressure. Typically if your blood pressure is well controlled, your kidneys are working well (determined by routine bloodwork) and you are not having any side effects, you should be able to get a one year supply of refills for your medication.     ED Prescriptions    Medication Sig Dispense Auth. Provider   lisinopril (ZESTRIL) 20 MG tablet Take 1 tablet (20 mg total) by mouth daily. 30 tablet Lurene Shadow, [place]     PDMP not reviewed this encounter.   Lurene Shadow, [place] 07/09/19 [phone]

## 2019-07-18 ENCOUNTER — Ambulatory Visit (INDEPENDENT_AMBULATORY_CARE_PROVIDER_SITE_OTHER): Payer: Self-pay | Admitting: Sports Medicine

## 2019-07-18 ENCOUNTER — Other Ambulatory Visit: Payer: Self-pay

## 2019-07-18 DIAGNOSIS — I10 Essential (primary) hypertension: Secondary | ICD-10-CM

## 2019-07-18 DIAGNOSIS — Z0000 Encounter for general adult medical examination without abnormal findings: Secondary | ICD-10-CM

## 2019-07-18 MED ORDER — LISINOPRIL 20 MG PO TABS: 20.0000 mg | ORAL_TABLET | Freq: Every day | ORAL | 3 refills | Status: DC

## 2019-07-18 NOTE — Assessment & Plan Note (Addendum)
Blood pressure is elevated but he was off of his medication for some time now, checking routine labs, urgent care restarted his blood pressure medicine, he has been on it for a week now. It is now adequately controlled.

## 2019-07-18 NOTE — Assessment & Plan Note (Signed)
Declines flu and tetanus shots

## 2019-07-18 NOTE — Progress Notes (Signed)
Subjective:    CC: Follow-up  HPI: Douglas Lang has not been here in some time, he has hypertension, ran out of blood pressure medicine and his blood pressure was in the 150s, he went to urgent care, they refilled his medication and it is now controlled.  He is due for labs, refills, he declines flu and tetanus vaccination.  I reviewed the past medical history, family history, social history, surgical history, and allergies today and no changes were needed.  Please see the problem list section below in epic for further details.  Past Medical History: Past Medical History:  Diagnosis Date  . ADHD (attention deficit hyperactivity disorder)    no current med.  . Anxiety   . Bulging lumbar disc   . Family history of adverse reaction to anesthesia    pt's mother and maternal grandfather have hx. of being hard to wake up post-op  . GERD (gastroesophageal reflux disease)    no current med.  . Hypertension    states under control with med., has been on med. since 2018  . Inguinal hernia, left 12/2017  . Seasonal allergies    Past Surgical History: Past Surgical History:  Procedure Laterality Date  . INGUINAL HERNIA REPAIR Left 01/19/2018   Procedure: LEFT INGUINAL HERNIA REPAIR WITH MESH;  Surgeon: Harriette Bouillon, MD;  Location: [facility];  Service: General;  Laterality: Left;  . INSERTION OF MESH Left 01/19/2018   Procedure: INSERTION OF MESH;  Surgeon: Harriette Bouillon, MD;  Location: [facility];  Service: General;  Laterality: Left;  . NO PAST SURGERIES     Social History: Social History   Socioeconomic History  . Marital status: Single    Spouse name: Not on file  . Number of children: Not on file  . Years of education: Not on file  . Highest education level: Not on file  Occupational History  . Not on file  Social Needs  . Financial resource strain: Not on file  . Food insecurity    Worry: Not on file    Inability: Not on file  .  Transportation needs    Medical: Not on file    Non-medical: Not on file  Tobacco Use  . Smoking status: Never Smoker  . Smokeless tobacco: Never Used  Substance and Sexual Activity  . Alcohol use: Yes    Comment: occasionally  . Drug use: No  . Sexual activity: Not on file  Lifestyle  . Physical activity    Days per week: Not on file    Minutes per session: Not on file  . Stress: Not on file  Relationships  . Social Musician on phone: Not on file    Gets together: Not on file    Attends religious service: Not on file    Active member of club or organization: Not on file    Attends meetings of clubs or organizations: Not on file    Relationship status: Not on file  Other Topics Concern  . Not on file  Social History Narrative  . Not on file   Family History: Family History  Problem Relation Age of Onset  . Allergies Mother   . Fibromyalgia Mother   . Anesthesia problems Mother        hard to wake up post-op  . Hypertension Father   . ALS Father   . Anesthesia problems Maternal Grandfather        hard to wake up p ost-op  Allergies: No Known Allergies Medications: See med rec.  Review of Systems: No fevers, chills, night sweats, weight loss, chest pain, or shortness of breath.   Objective:    General: Well Developed, well nourished, and in no acute distress.  Neuro: Alert and oriented x3, extra-ocular muscles intact, sensation grossly intact.  HEENT: Normocephalic, atraumatic, pupils equal round reactive to light, neck supple, no masses, no lymphadenopathy, thyroid nonpalpable.  Skin: Warm and dry, no rashes. Cardiac: Regular rate and rhythm, no murmurs rubs or gallops, no lower extremity edema.  Respiratory: Clear to auscultation bilaterally. Not using accessory muscles, speaking in full sentences.  21-gauge needle advanced into the left cubital vein, we obtained blood from here.  Impression and Recommendations:    Essential hypertension,  benign Blood pressure is elevated but he was off of his medication for some time now, checking routine labs, urgent care restarted his blood pressure medicine, he has been on it for a week now. It is now adequately controlled.  Annual physical exam Declines flu and tetanus shots   ___________________________________________ Gwen Her. Dianah Field, M.D., ABFM., CAQSM. Primary Care and Sports Medicine [facility]  Adjunct Professor of [employer]

## 2019-07-19 LAB — COMPLETE METABOLIC PANEL WITH GFR
AG Ratio: 1.8 (calc) (ref 1.0–2.5)
ALT: 121 U/L — ABNORMAL HIGH (ref 9–46)
AST: 47 U/L — ABNORMAL HIGH (ref 10–40)
Albumin: 4.6 g/dL (ref 3.6–5.1)
Alkaline phosphatase (APISO): 49 U/L (ref 36–130)
BUN: 13 mg/dL (ref 7–25)
CO2: 29 mmol/L (ref 20–32)
Calcium: 9.3 mg/dL (ref 8.6–10.3)
Chloride: 102 mmol/L (ref 98–110)
Creat: 1.22 mg/dL (ref 0.60–1.35)
GFR, Est African American: 88 mL/min/{1.73_m2} (ref >=60)
GFR, Est Non African American: 76 mL/min/{1.73_m2} (ref >=60)
Globulin: 2.6 g/dL (calc) (ref 1.9–3.7)
Glucose, Bld: 82 mg/dL (ref 65–99)
Potassium: 4.6 mmol/L (ref 3.5–5.3)
Sodium: 139 mmol/L (ref 135–146)
Total Bilirubin: 0.6 mg/dL (ref 0.2–1.2)
Total Protein: 7.2 g/dL (ref 6.1–8.1)

## 2019-07-19 LAB — LIPID PANEL W/REFLEX DIRECT LDL
Cholesterol: 177 mg/dL (ref <200)
HDL: 33 mg/dL — ABNORMAL LOW (ref >=40)
LDL Cholesterol (Calc): 104 mg/dL (calc) — ABNORMAL HIGH
Non-HDL Cholesterol (Calc): 144 mg/dL (calc) — ABNORMAL HIGH (ref <130)
Total CHOL/HDL Ratio: 5.4 (calc) — ABNORMAL HIGH (ref <5.0)
Triglycerides: 297 mg/dL — ABNORMAL HIGH (ref <150)

## 2019-07-19 LAB — CBC
HCT: 48.2 % (ref 38.5–50.0)
Hemoglobin: 16.9 g/dL (ref 13.2–17.1)
MCH: 32.9 pg (ref 27.0–33.0)
MCHC: 35.1 g/dL (ref 32.0–36.0)
MCV: 93.8 fL (ref 80.0–100.0)
MPV: 11.7 fL (ref 7.5–12.5)
Platelets: 256 10*3/uL (ref 140–400)
RBC: 5.14 10*6/uL (ref 4.20–5.80)
RDW: 12 % (ref 11.0–15.0)
WBC: 6 10*3/uL (ref 3.8–10.8)

## 2019-07-19 LAB — TSH: TSH: 1.39 mIU/L (ref 0.40–4.50)

## 2019-07-19 LAB — VITAMIN D 25 HYDROXY (VIT D DEFICIENCY, FRACTURES): Vit D, 25-Hydroxy: 35 ng/mL (ref 30–100)

## 2022-10-10 ENCOUNTER — Encounter: Payer: Self-pay | Admitting: Family Medicine

## 2022-10-10 ENCOUNTER — Ambulatory Visit: Payer: PRIVATE HEALTH INSURANCE | Admitting: Family Medicine

## 2022-10-10 VITALS — BP 162/86 | HR 85 | Temp 98.0°F | Ht 68.0 in | Wt 219.2 lb

## 2022-10-10 DIAGNOSIS — I10 Essential (primary) hypertension: Secondary | ICD-10-CM

## 2022-10-10 DIAGNOSIS — Z7689 Persons encountering health services in other specified circumstances: Secondary | ICD-10-CM

## 2022-10-10 DIAGNOSIS — J301 Allergic rhinitis due to pollen: Secondary | ICD-10-CM | POA: Diagnosis not present

## 2022-10-10 DIAGNOSIS — F4010 Social phobia, unspecified: Secondary | ICD-10-CM | POA: Diagnosis not present

## 2022-10-10 DIAGNOSIS — M2550 Pain in unspecified joint: Secondary | ICD-10-CM | POA: Diagnosis not present

## 2022-10-10 MED ORDER — LISINOPRIL 20 MG PO TABS: 20.0000 mg | ORAL_TABLET | Freq: Every day | ORAL | 0 refills | Status: DC

## 2022-10-10 MED ORDER — DICLOFENAC SODIUM 50 MG PO TBEC: 50.0000 mg | DELAYED_RELEASE_TABLET | Freq: Two times a day (BID) | ORAL | 1 refills | Status: DC | PRN

## 2022-10-10 NOTE — Progress Notes (Signed)
New Patient Office Visit  Subjective    Patient ID: Douglas Lang, male    DOB: [birth year]  Age: 37 y.o. MRN: [id number]  CC:  Chief Complaint  Patient presents with   New Patient (Initial Visit)    Patient in office to est PCP - patient states has been out of BP medication for several months due to not having a PCP.     HPI Douglas Lang presents to establish care. He reports he hasn't been seen in a few years due to lost of insurance. He has hx of HTN. He was taking Lisinopril 20mg  daily and tolerated this. No chest pains or SOB since being out of his medicine for the last month. He has had headaches since being off of his medicines. 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  Goal less than 140/90. He was taking NSAID for arthritis and pain in his neck, arms, hands, legs. He does mention he feels like he has 'fibromyalgia'. He says stress and cold makes the pain worse. He is working for [employer] and has a lot of physical demand. He is looking to do another job that's less strenuous.  He would like to defer flu vaccine today. Pt has hx of anxiety. Use to be on Paxil for his anxiety. He had no emotion with this one so if need be, would like to try another one. He says he started this when he was 37 y.o when interacting with other teenagers. He was also seeing counseling at that time. He reports large crowds make him anxious.   Outpatient Encounter Medications as of 10/10/2022  Medication Sig   cetirizine (ZYRTEC) 10 MG tablet Take 10 mg by mouth daily.   diclofenac (VOLTAREN) 50 MG EC tablet Take 1 tablet (50 mg total) by mouth 2 (two) times daily as needed (with food).   lisinopril (ZESTRIL) 20 MG tablet Take 1 tablet (20 mg total) by mouth daily.   [DISCONTINUED] lisinopril (ZESTRIL) 20 MG tablet Take 1 tablet (20 mg total) by mouth daily. (Patient not taking: Reported on 10/10/2022)   No facility-administered encounter medications on file as of  10/10/2022.    Past Medical History:  Diagnosis Date   Anxiety    Bulging lumbar disc    GERD (gastroesophageal reflux disease)    no current med.   Hypertension    states under control with med., has been on med. since 2018   Seasonal allergies     Past Surgical History:  Procedure Laterality Date   INGUINAL HERNIA REPAIR Left 01/19/2018   Procedure: LEFT INGUINAL HERNIA REPAIR WITH MESH;  Surgeon: Erroll Luna, MD;  Location: [place];  Service: General;  Laterality: Left;   INSERTION OF MESH Left 01/19/2018   Procedure: INSERTION OF MESH;  Surgeon: Erroll Luna, MD;  Location: [place];  Service: General;  Laterality: Left;    Family History  Problem Relation Age of Onset   Allergies Mother    Fibromyalgia Mother    Anesthesia problems Mother        hard to wake up post-op   Hypertension Father    ALS Father    Fibromyalgia Maternal Grandmother    Fibromyalgia Maternal Grandfather    ALS Paternal Grandfather     Social History   Socioeconomic History   Marital status: Married    Spouse name: Sharyn Lull   Number of children: Not on file   Years of education: Not on  file   Highest education level: Master's degree (e.g., MA, MS, MEng, MEd, MSW, MBA)  Occupational History   Occupation: [employer]  driver  Tobacco Use   Smoking status: Never   Smokeless tobacco: Never  Vaping Use   Vaping Use: Never used  Substance and Sexual Activity   Alcohol use: Yes    Comment: rarely beer   Drug use: No   Sexual activity: Yes    Partners: Female    Birth control/protection: Surgical  Other Topics Concern   Not on file  Social History Narrative   Master's in computer science   Has 3 step children with wife Sharyn Lull 16, 78, 6 y.o autistic   Social Determinants of Health   Financial Resource Strain: Not on file  Food Insecurity: Not on file  Transportation Needs: Not on file  Physical Activity: Not on file  Stress: Not on file   Social Connections: Not on file  Intimate Partner Violence: Not on file    Review of Systems  Cardiovascular:  Negative for chest pain and palpitations.  Musculoskeletal:  Positive for joint pain.  Neurological:  Positive for headaches. Negative for dizziness, tingling, focal weakness and weakness.  Endo/Heme/Allergies:  Positive for environmental allergies.  Psychiatric/Behavioral:  Negative for hallucinations, substance abuse and suicidal ideas. The patient is nervous/anxious.   All other systems reviewed and are negative.       Objective    BP (!) 160/90   Pulse 85   Temp 98 F (36.7 C)   Ht 5\' 8"  (1.727 m)   Wt 219 lb 4 oz (99.5 kg)   SpO2 99%   BMI 33.34 kg/m   Physical Exam Vitals and nursing note reviewed.  Constitutional:      Appearance: He is obese.  HENT:     Head: Normocephalic and atraumatic.     Right Ear: External ear normal.     Left Ear: External ear normal.     Nose: Nose normal.     Mouth/Throat:     Mouth: Mucous membranes are moist.     Pharynx: Oropharynx is clear.  Eyes:     Conjunctiva/sclera: Conjunctivae normal.     Pupils: Pupils are equal, round, and reactive to light.  Cardiovascular:     Rate and Rhythm: Normal rate and regular rhythm.     Pulses: Normal pulses.     Heart sounds: Normal heart sounds.  Pulmonary:     Effort: Pulmonary effort is normal.     Breath sounds: Normal breath sounds.  Abdominal:     General: Abdomen is flat.  Skin:    General: Skin is warm.     Capillary Refill: Capillary refill takes less than 2 seconds.  Neurological:     General: No focal deficit present.     Mental Status: He is alert and oriented to person, place, and time. Mental status is at baseline.  Psychiatric:        Mood and Affect: Mood normal.        Behavior: Behavior normal.        Thought Content: Thought content normal.        Judgment: Judgment normal.        Assessment & Plan:   Problem List Items Addressed This Visit        Cardiovascular and Mediastinum   Essential hypertension, benign   Relevant Medications   lisinopril (ZESTRIL) 20 MG tablet   Other Visit Diagnoses     Encounter to establish care with  new doctor    -  Primary   Seasonal allergic rhinitis due to pollen       Arthralgia, unspecified joint       Relevant Medications   diclofenac (VOLTAREN) 50 MG EC tablet   Social anxiety disorder          Pt to restart his blood pressure medicine. Advised to postpone dot clearance form until at goal 140/.90 or less. He will monitor blood pressures at home. Report to me high readings above this goal before next appointment. Can increase medicine at that time if needed. Will return in 2 weeks for annual and HTN follow up. Sent in Diclofenac 50mg  bid prn for joint pain. Monitor social anxiety d/o right now. Pt defers treatment at this time. To continue OTC zyrtec for seasonal allergies. Defer flu vaccine today  Return in about 2 weeks (around 10/24/2022) for Annual Physical.   12/23/2022, MD

## 2022-10-17 ENCOUNTER — Ambulatory Visit (INDEPENDENT_AMBULATORY_CARE_PROVIDER_SITE_OTHER): Payer: PRIVATE HEALTH INSURANCE | Admitting: Family Medicine

## 2022-10-17 ENCOUNTER — Encounter: Payer: Self-pay | Admitting: Family Medicine

## 2022-10-17 VITALS — BP 129/89 | HR 86 | Temp 97.9°F | Ht 68.0 in | Wt 217.2 lb

## 2022-10-17 DIAGNOSIS — Z1159 Encounter for screening for other viral diseases: Secondary | ICD-10-CM

## 2022-10-17 DIAGNOSIS — R7302 Impaired glucose tolerance (oral): Secondary | ICD-10-CM | POA: Diagnosis not present

## 2022-10-17 DIAGNOSIS — I10 Essential (primary) hypertension: Secondary | ICD-10-CM

## 2022-10-17 DIAGNOSIS — Z0000 Encounter for general adult medical examination without abnormal findings: Secondary | ICD-10-CM

## 2022-10-17 DIAGNOSIS — Z13220 Encounter for screening for lipoid disorders: Secondary | ICD-10-CM

## 2022-10-17 DIAGNOSIS — Z136 Encounter for screening for cardiovascular disorders: Secondary | ICD-10-CM

## 2022-10-17 MED ORDER — LOSARTAN POTASSIUM 25 MG PO TABS: 25.0000 mg | ORAL_TABLET | Freq: Every day | ORAL | 1 refills | Status: AC

## 2022-10-17 NOTE — Progress Notes (Signed)
Complete physical exam  Patient: Douglas Lang   DOB: 11-07-1985   37 y.o. Male  MRN: [id number]  Subjective:    Chief Complaint  Patient presents with   Annual Exam    Patient in office for annual exam =    Douglas Lang is a 37 y.o. male who presents today for a complete physical exam. He reports consuming a general diet. The patient does not participate in regular exercise at present. He generally feels well. He reports sleeping well. He does have additional problems to discuss today.   Pt was started on Lisinopril 20mg  due to HTN 2 weeks ago. Checking blood pressures at home and have been stable. Does cause cough. He would like to try a different medicine for HTN.  Most recent fall risk assessment:    10/17/2022    8:19 AM  Fall Risk   Falls in the past year? 0  Number falls in past yr: 0  Injury with Fall? 0  Risk for fall due to : No Fall Risks  Follow up Falls evaluation completed     Most recent depression screenings:    10/17/2022    8:20 AM 10/10/2022    8:24 AM  PHQ 2/9 Scores  PHQ - 2 Score 3 1  PHQ- 9 Score 8 6    Vision:Within last year and Dental: No current dental problems  Patient Active Problem List   Diagnosis Date Noted   Left inguinal hernia 12/09/2017   Dyspepsia 06/23/2016   Transaminitis 03/21/2016   Depression 05/16/2015   Annual physical exam 05/16/2015   Essential hypertension, benign 04/02/2015   Left lumbar radiculopathy 03/05/2015   Left cervical radiculopathy 03/05/2015   Past Medical History:  Diagnosis Date   Anxiety    Bulging lumbar disc    GERD (gastroesophageal reflux disease)    no current med.   Hypertension    states under control with med., has been on med. since 2018   Seasonal allergies    Past Surgical History:  Procedure Laterality Date   INGUINAL HERNIA REPAIR Left 01/19/2018   Procedure: LEFT INGUINAL HERNIA REPAIR WITH MESH;  Surgeon: Erroll Luna, MD;  Location: [place];  Service:  General;  Laterality: Left;   INSERTION OF MESH Left 01/19/2018   Procedure: INSERTION OF MESH;  Surgeon: Erroll Luna, MD;  Location: [place];  Service: General;  Laterality: Left;   Social History   Tobacco Use   Smoking status: Never   Smokeless tobacco: Never  Vaping Use   Vaping Use: Never used  Substance Use Topics   Alcohol use: Yes    Comment: rarely beer   Drug use: No      Patient Care Team: Leeanne Rio, MD as PCP - General (Family Medicine)   Outpatient Medications Prior to Visit  Medication Sig Note   cetirizine (ZYRTEC) 10 MG tablet Take 10 mg by mouth daily.    diclofenac (VOLTAREN) 50 MG EC tablet Take 1 tablet (50 mg total) by mouth 2 (two) times daily as needed (with food).    [DISCONTINUED] lisinopril (ZESTRIL) 20 MG tablet Take 1 tablet (20 mg total) by mouth daily. 10/17/2022: cough   No facility-administered medications prior to visit.    Review of Systems  Respiratory:  Positive for cough.        Cough since starting Lisinopril 20mg   All other systems reviewed and are negative.        Objective:  BP 129/89   Pulse 86   Temp 97.9 F (36.6 C)   Ht 5\' 8"  (1.727 m)   Wt 217 lb 3 oz (98.5 kg)   SpO2 99%   BMI 33.02 kg/m     Physical Exam Vitals and nursing note reviewed.  Constitutional:      Appearance: Normal appearance. He is normal weight.  HENT:     Head: Normocephalic and atraumatic.     Right Ear: Tympanic membrane, ear canal and external ear normal.     Left Ear: Tympanic membrane, ear canal and external ear normal.     Nose: Nose normal.     Mouth/Throat:     Mouth: Mucous membranes are moist.  Eyes:     Conjunctiva/sclera: Conjunctivae normal.     Pupils: Pupils are equal, round, and reactive to light.  Cardiovascular:     Rate and Rhythm: Normal rate and regular rhythm.     Pulses: Normal pulses.     Heart sounds: Normal heart sounds.  Pulmonary:     Effort: Pulmonary effort is normal.      Breath sounds: Normal breath sounds.  Abdominal:     General: Abdomen is flat. Bowel sounds are normal.  Skin:    General: Skin is warm.     Capillary Refill: Capillary refill takes less than 2 seconds.  Neurological:     General: No focal deficit present.     Mental Status: He is alert and oriented to person, place, and time. Mental status is at baseline.  Psychiatric:        Mood and Affect: Mood normal.        Behavior: Behavior normal.        Thought Content: Thought content normal.        Judgment: Judgment normal.     No results found for any visits on 10/17/22. Last CBC Lab Results  Component Value Date   WBC 6.0 07/18/2019   HGB 16.9 07/18/2019   HCT 48.2 07/18/2019   MCV 93.8 07/18/2019   MCH 32.9 07/18/2019   RDW 12.0 07/18/2019   PLT 256 07/18/2019   Last metabolic panel Lab Results  Component Value Date   GLUCOSE 82 07/18/2019   NA 139 07/18/2019   K 4.6 07/18/2019   CL 102 07/18/2019   CO2 29 07/18/2019   BUN 13 07/18/2019   CREATININE 1.22 07/18/2019   GFRNONAA 76 07/18/2019   CALCIUM 9.3 07/18/2019   PROT 7.2 07/18/2019   ALBUMIN 4.3 01/21/2016   BILITOT 0.6 07/18/2019   ALKPHOS 38 (L) 01/21/2016   AST 47 (H) 07/18/2019   ALT 121 (H) 07/18/2019   Last lipids Lab Results  Component Value Date   CHOL 177 07/18/2019   HDL 33 (L) 07/18/2019   LDLCALC 104 (H) 07/18/2019   TRIG 297 (H) 07/18/2019   CHOLHDL 5.4 (H) 07/18/2019   Last hemoglobin A1c Lab Results  Component Value Date   HGBA1C 4.8 01/21/2016        Assessment & Plan:    Routine Health Maintenance and Physical Exam  Immunization History  Administered Date(s) Administered   Moderna Sars-Covid-2 Vaccination 02/04/2020, 03/05/2020    Health Maintenance  Topic Date Due   HIV Screening  Never done   Hepatitis C Screening  Never done   COVID-19 Vaccine (3 - 2023-24 season) 10/26/2022 (Originally 05/23/2022)   INFLUENZA VACCINE  12/21/2022 (Originally 04/22/2022)   HPV VACCINES   Aged Out   DTaP/Tdap/Td  Discontinued  Discussed health benefits of physical activity, and encouraged him to engage in regular exercise appropriate for his age and condition.  Problem List Items Addressed This Visit       Other   Annual physical exam - Primary   Other Visit Diagnoses     Encounter for lipid screening for cardiovascular disease       Relevant Orders   Lipid panel   Impaired glucose tolerance       Relevant Orders   CBC with Differential/Platelet   Comprehensive metabolic panel   Hemoglobin A1c   Primary hypertension       Relevant Medications   losartan (COZAAR) 25 MG tablet   Need for hepatitis C screening test       Relevant Orders   Hepatitis C antibody   Screening for viral disease       Relevant Orders   HIV Antibody (routine testing w rflx)      Return in about 3 months (around 01/16/2023) for Hypertension. Screening labs today Change Lisinopril 20mg  to Losartan 25mg  daily. To continue to monitor blood pressures. See back in 3 months for follow up. Declines vaccines today    Leeanne Rio, MD

## 2022-10-18 LAB — CBC WITH DIFFERENTIAL/PLATELET
Basophils Absolute: 0 10*3/uL (ref 0.0–0.2)
Basos: 1 %
EOS (ABSOLUTE): 0.1 10*3/uL (ref 0.0–0.4)
Eos: 1 %
Hematocrit: 48.8 % (ref 37.5–51.0)
Hemoglobin: 16.9 g/dL (ref 13.0–17.7)
Immature Grans (Abs): 0 10*3/uL (ref 0.0–0.1)
Immature Granulocytes: 1 %
Lymphocytes Absolute: 1.9 10*3/uL (ref 0.7–3.1)
Lymphs: 35 %
MCH: 31.1 pg (ref 26.6–33.0)
MCHC: 34.6 g/dL (ref 31.5–35.7)
MCV: 90 fL (ref 79–97)
Monocytes Absolute: 0.3 10*3/uL (ref 0.1–0.9)
Monocytes: 6 %
Neutrophils Absolute: 3 10*3/uL (ref 1.4–7.0)
Neutrophils: 56 %
Platelets: 242 10*3/uL (ref 150–450)
RBC: 5.44 x10E6/uL (ref 4.14–5.80)
RDW: 11.6 % (ref 11.6–15.4)
WBC: 5.3 10*3/uL (ref 3.4–10.8)

## 2022-10-18 LAB — COMPREHENSIVE METABOLIC PANEL
ALT: 82 IU/L — ABNORMAL HIGH (ref 0–44)
AST: 33 IU/L (ref 0–40)
Albumin/Globulin Ratio: 1.8 (ref 1.2–2.2)
Albumin: 4.5 g/dL (ref 4.1–5.1)
Alkaline Phosphatase: 55 IU/L (ref 44–121)
BUN/Creatinine Ratio: 22 — ABNORMAL HIGH (ref 9–20)
BUN: 19 mg/dL (ref 6–20)
Bilirubin Total: 0.4 mg/dL (ref 0.0–1.2)
CO2: 21 mmol/L (ref 20–29)
Calcium: 9.3 mg/dL (ref 8.7–10.2)
Chloride: 104 mmol/L (ref 96–106)
Creatinine, Ser: 0.86 mg/dL (ref 0.76–1.27)
Globulin, Total: 2.5 g/dL (ref 1.5–4.5)
Glucose: 84 mg/dL (ref 70–99)
Potassium: 5 mmol/L (ref 3.5–5.2)
Sodium: 141 mmol/L (ref 134–144)
Total Protein: 7 g/dL (ref 6.0–8.5)
eGFR: 114 mL/min/{1.73_m2} (ref >59)

## 2022-10-18 LAB — HIV ANTIBODY (ROUTINE TESTING W REFLEX): HIV Screen 4th Generation wRfx: NONREACTIVE

## 2022-10-18 LAB — LIPID PANEL
Chol/HDL Ratio: 5 ratio (ref 0.0–5.0)
Cholesterol, Total: 181 mg/dL (ref 100–199)
HDL: 36 mg/dL — ABNORMAL LOW (ref >39)
LDL Chol Calc (NIH): 112 mg/dL — ABNORMAL HIGH (ref 0–99)
Triglycerides: 186 mg/dL — ABNORMAL HIGH (ref 0–149)
VLDL Cholesterol Cal: 33 mg/dL (ref 5–40)

## 2022-10-18 LAB — HEMOGLOBIN A1C
Est. average glucose Bld gHb Est-mCnc: 100 mg/dL
Hgb A1c MFr Bld: 5.1 % (ref 4.8–5.6)

## 2022-10-18 LAB — HEPATITIS C ANTIBODY: Hep C Virus Ab: REACTIVE — AB

## 2022-10-20 ENCOUNTER — Other Ambulatory Visit: Payer: Self-pay | Admitting: Family Medicine

## 2022-10-20 DIAGNOSIS — R768 Other specified abnormal immunological findings in serum: Secondary | ICD-10-CM

## 2022-10-22 ENCOUNTER — Other Ambulatory Visit: Payer: Self-pay | Admitting: Family Medicine

## 2022-10-22 DIAGNOSIS — R768 Other specified abnormal immunological findings in serum: Secondary | ICD-10-CM

## 2022-10-24 LAB — HCV RNA QUANT: Hepatitis C Quantitation: NOT DETECTED IU/mL

## 2023-02-04 ENCOUNTER — Other Ambulatory Visit: Payer: Self-pay | Admitting: Family Medicine

## 2023-02-04 DIAGNOSIS — M2550 Pain in unspecified joint: Secondary | ICD-10-CM
